# Patient Record
Sex: Female | Born: 1935 | Race: White | Hispanic: No | Smoking: Former smoker
Health system: Southern US, Community
[De-identification: ages and names within clinical notes are randomized; demographics above are authoritative.]

## PROBLEM LIST (undated history)

## (undated) DIAGNOSIS — I10 Essential (primary) hypertension: Secondary | ICD-10-CM

## (undated) DIAGNOSIS — I509 Heart failure, unspecified: Secondary | ICD-10-CM

## (undated) DIAGNOSIS — I639 Cerebral infarction, unspecified: Secondary | ICD-10-CM

## (undated) DIAGNOSIS — E119 Type 2 diabetes mellitus without complications: Secondary | ICD-10-CM

## (undated) HISTORY — DX: Heart failure, unspecified: I50.9

## (undated) HISTORY — PX: BACK SURGERY: SHX140

## (undated) HISTORY — DX: Cerebral infarction, unspecified: I63.9

## (undated) HISTORY — DX: Essential (primary) hypertension: I10

## (undated) HISTORY — PX: APPENDECTOMY: SHX54

## (undated) HISTORY — DX: Type 2 diabetes mellitus without complications: E11.9

## (undated) HISTORY — PX: WRIST FRACTURE SURGERY: SHX121

## (undated) HISTORY — PX: OTHER SURGICAL HISTORY: SHX169

---

## 2015-09-07 DIAGNOSIS — E782 Mixed hyperlipidemia: Secondary | ICD-10-CM | POA: Diagnosis not present

## 2015-09-07 DIAGNOSIS — E1122 Type 2 diabetes mellitus with diabetic chronic kidney disease: Secondary | ICD-10-CM | POA: Diagnosis not present

## 2015-09-07 DIAGNOSIS — E119 Type 2 diabetes mellitus without complications: Secondary | ICD-10-CM | POA: Diagnosis not present

## 2015-09-10 DIAGNOSIS — J449 Chronic obstructive pulmonary disease, unspecified: Secondary | ICD-10-CM | POA: Diagnosis not present

## 2015-09-13 DIAGNOSIS — Z6832 Body mass index (BMI) 32.0-32.9, adult: Secondary | ICD-10-CM | POA: Diagnosis not present

## 2015-09-13 DIAGNOSIS — E1142 Type 2 diabetes mellitus with diabetic polyneuropathy: Secondary | ICD-10-CM | POA: Diagnosis not present

## 2015-09-13 DIAGNOSIS — Z139 Encounter for screening, unspecified: Secondary | ICD-10-CM | POA: Diagnosis not present

## 2015-09-13 DIAGNOSIS — Z0000 Encounter for general adult medical examination without abnormal findings: Secondary | ICD-10-CM | POA: Diagnosis not present

## 2015-09-13 DIAGNOSIS — E782 Mixed hyperlipidemia: Secondary | ICD-10-CM | POA: Diagnosis not present

## 2015-09-13 DIAGNOSIS — Z1389 Encounter for screening for other disorder: Secondary | ICD-10-CM | POA: Diagnosis not present

## 2015-09-13 DIAGNOSIS — I129 Hypertensive chronic kidney disease with stage 1 through stage 4 chronic kidney disease, or unspecified chronic kidney disease: Secondary | ICD-10-CM | POA: Diagnosis not present

## 2015-09-13 DIAGNOSIS — E1169 Type 2 diabetes mellitus with other specified complication: Secondary | ICD-10-CM | POA: Diagnosis not present

## 2015-10-10 DIAGNOSIS — J449 Chronic obstructive pulmonary disease, unspecified: Secondary | ICD-10-CM | POA: Diagnosis not present

## 2015-10-12 DIAGNOSIS — J9611 Chronic respiratory failure with hypoxia: Secondary | ICD-10-CM | POA: Diagnosis not present

## 2015-10-12 DIAGNOSIS — J449 Chronic obstructive pulmonary disease, unspecified: Secondary | ICD-10-CM | POA: Diagnosis not present

## 2015-10-12 DIAGNOSIS — Z9981 Dependence on supplemental oxygen: Secondary | ICD-10-CM | POA: Diagnosis not present

## 2015-10-12 DIAGNOSIS — Z6832 Body mass index (BMI) 32.0-32.9, adult: Secondary | ICD-10-CM | POA: Diagnosis not present

## 2015-11-08 DIAGNOSIS — J449 Chronic obstructive pulmonary disease, unspecified: Secondary | ICD-10-CM | POA: Diagnosis not present

## 2015-12-09 DIAGNOSIS — J449 Chronic obstructive pulmonary disease, unspecified: Secondary | ICD-10-CM | POA: Diagnosis not present

## 2016-01-08 DIAGNOSIS — J449 Chronic obstructive pulmonary disease, unspecified: Secondary | ICD-10-CM | POA: Diagnosis not present

## 2016-01-16 DIAGNOSIS — E782 Mixed hyperlipidemia: Secondary | ICD-10-CM | POA: Diagnosis not present

## 2016-01-16 DIAGNOSIS — E1122 Type 2 diabetes mellitus with diabetic chronic kidney disease: Secondary | ICD-10-CM | POA: Diagnosis not present

## 2016-01-16 DIAGNOSIS — E1142 Type 2 diabetes mellitus with diabetic polyneuropathy: Secondary | ICD-10-CM | POA: Diagnosis not present

## 2016-01-23 DIAGNOSIS — E1142 Type 2 diabetes mellitus with diabetic polyneuropathy: Secondary | ICD-10-CM | POA: Diagnosis not present

## 2016-01-23 DIAGNOSIS — E1169 Type 2 diabetes mellitus with other specified complication: Secondary | ICD-10-CM | POA: Diagnosis not present

## 2016-01-23 DIAGNOSIS — Z9981 Dependence on supplemental oxygen: Secondary | ICD-10-CM | POA: Diagnosis not present

## 2016-01-23 DIAGNOSIS — Z7189 Other specified counseling: Secondary | ICD-10-CM | POA: Diagnosis not present

## 2016-01-23 DIAGNOSIS — J449 Chronic obstructive pulmonary disease, unspecified: Secondary | ICD-10-CM | POA: Diagnosis not present

## 2016-01-30 DIAGNOSIS — J449 Chronic obstructive pulmonary disease, unspecified: Secondary | ICD-10-CM | POA: Diagnosis not present

## 2016-02-08 DIAGNOSIS — J449 Chronic obstructive pulmonary disease, unspecified: Secondary | ICD-10-CM | POA: Diagnosis not present

## 2016-03-09 DIAGNOSIS — J449 Chronic obstructive pulmonary disease, unspecified: Secondary | ICD-10-CM | POA: Diagnosis not present

## 2016-04-09 DIAGNOSIS — J449 Chronic obstructive pulmonary disease, unspecified: Secondary | ICD-10-CM | POA: Diagnosis not present

## 2016-05-10 DIAGNOSIS — J449 Chronic obstructive pulmonary disease, unspecified: Secondary | ICD-10-CM | POA: Diagnosis not present

## 2016-05-30 DIAGNOSIS — H353232 Exudative age-related macular degeneration, bilateral, with inactive choroidal neovascularization: Secondary | ICD-10-CM | POA: Diagnosis not present

## 2016-06-09 DIAGNOSIS — J449 Chronic obstructive pulmonary disease, unspecified: Secondary | ICD-10-CM | POA: Diagnosis not present

## 2016-06-12 DIAGNOSIS — E1142 Type 2 diabetes mellitus with diabetic polyneuropathy: Secondary | ICD-10-CM | POA: Diagnosis not present

## 2016-06-12 DIAGNOSIS — E1122 Type 2 diabetes mellitus with diabetic chronic kidney disease: Secondary | ICD-10-CM | POA: Diagnosis not present

## 2016-06-12 DIAGNOSIS — E782 Mixed hyperlipidemia: Secondary | ICD-10-CM | POA: Diagnosis not present

## 2016-06-24 DIAGNOSIS — I129 Hypertensive chronic kidney disease with stage 1 through stage 4 chronic kidney disease, or unspecified chronic kidney disease: Secondary | ICD-10-CM | POA: Diagnosis not present

## 2016-06-24 DIAGNOSIS — E1142 Type 2 diabetes mellitus with diabetic polyneuropathy: Secondary | ICD-10-CM | POA: Diagnosis not present

## 2016-06-24 DIAGNOSIS — N183 Chronic kidney disease, stage 3 (moderate): Secondary | ICD-10-CM | POA: Diagnosis not present

## 2016-06-24 DIAGNOSIS — E782 Mixed hyperlipidemia: Secondary | ICD-10-CM | POA: Diagnosis not present

## 2016-07-10 DIAGNOSIS — J449 Chronic obstructive pulmonary disease, unspecified: Secondary | ICD-10-CM | POA: Diagnosis not present

## 2016-07-16 DIAGNOSIS — J449 Chronic obstructive pulmonary disease, unspecified: Secondary | ICD-10-CM | POA: Diagnosis not present

## 2016-07-16 DIAGNOSIS — Z6835 Body mass index (BMI) 35.0-35.9, adult: Secondary | ICD-10-CM | POA: Diagnosis not present

## 2016-08-09 DIAGNOSIS — J449 Chronic obstructive pulmonary disease, unspecified: Secondary | ICD-10-CM | POA: Diagnosis not present

## 2016-09-09 DIAGNOSIS — J449 Chronic obstructive pulmonary disease, unspecified: Secondary | ICD-10-CM | POA: Diagnosis not present

## 2016-09-11 DIAGNOSIS — B351 Tinea unguium: Secondary | ICD-10-CM | POA: Diagnosis not present

## 2016-09-17 DIAGNOSIS — B351 Tinea unguium: Secondary | ICD-10-CM | POA: Diagnosis not present

## 2016-09-25 DIAGNOSIS — B353 Tinea pedis: Secondary | ICD-10-CM | POA: Diagnosis not present

## 2016-10-09 DIAGNOSIS — J449 Chronic obstructive pulmonary disease, unspecified: Secondary | ICD-10-CM | POA: Diagnosis not present

## 2016-10-16 DIAGNOSIS — E782 Mixed hyperlipidemia: Secondary | ICD-10-CM | POA: Diagnosis not present

## 2016-10-16 DIAGNOSIS — E1142 Type 2 diabetes mellitus with diabetic polyneuropathy: Secondary | ICD-10-CM | POA: Diagnosis not present

## 2016-10-16 DIAGNOSIS — E1122 Type 2 diabetes mellitus with diabetic chronic kidney disease: Secondary | ICD-10-CM | POA: Diagnosis not present

## 2016-10-17 DIAGNOSIS — I739 Peripheral vascular disease, unspecified: Secondary | ICD-10-CM | POA: Diagnosis not present

## 2016-10-25 DIAGNOSIS — Z0000 Encounter for general adult medical examination without abnormal findings: Secondary | ICD-10-CM | POA: Diagnosis not present

## 2016-10-25 DIAGNOSIS — E1122 Type 2 diabetes mellitus with diabetic chronic kidney disease: Secondary | ICD-10-CM | POA: Diagnosis not present

## 2016-10-25 DIAGNOSIS — E1169 Type 2 diabetes mellitus with other specified complication: Secondary | ICD-10-CM | POA: Diagnosis not present

## 2016-10-25 DIAGNOSIS — E1142 Type 2 diabetes mellitus with diabetic polyneuropathy: Secondary | ICD-10-CM | POA: Diagnosis not present

## 2016-10-25 DIAGNOSIS — N183 Chronic kidney disease, stage 3 (moderate): Secondary | ICD-10-CM | POA: Diagnosis not present

## 2016-11-07 DIAGNOSIS — J449 Chronic obstructive pulmonary disease, unspecified: Secondary | ICD-10-CM | POA: Diagnosis not present

## 2016-11-15 DIAGNOSIS — E1122 Type 2 diabetes mellitus with diabetic chronic kidney disease: Secondary | ICD-10-CM | POA: Diagnosis not present

## 2016-11-15 DIAGNOSIS — E782 Mixed hyperlipidemia: Secondary | ICD-10-CM | POA: Diagnosis not present

## 2016-11-15 DIAGNOSIS — E1142 Type 2 diabetes mellitus with diabetic polyneuropathy: Secondary | ICD-10-CM | POA: Diagnosis not present

## 2016-11-27 DIAGNOSIS — I129 Hypertensive chronic kidney disease with stage 1 through stage 4 chronic kidney disease, or unspecified chronic kidney disease: Secondary | ICD-10-CM | POA: Diagnosis not present

## 2016-11-27 DIAGNOSIS — E782 Mixed hyperlipidemia: Secondary | ICD-10-CM | POA: Diagnosis not present

## 2016-11-27 DIAGNOSIS — N183 Chronic kidney disease, stage 3 (moderate): Secondary | ICD-10-CM | POA: Diagnosis not present

## 2016-11-27 DIAGNOSIS — E1169 Type 2 diabetes mellitus with other specified complication: Secondary | ICD-10-CM | POA: Diagnosis not present

## 2016-12-08 DIAGNOSIS — J449 Chronic obstructive pulmonary disease, unspecified: Secondary | ICD-10-CM | POA: Diagnosis not present

## 2016-12-24 DIAGNOSIS — J449 Chronic obstructive pulmonary disease, unspecified: Secondary | ICD-10-CM | POA: Diagnosis not present

## 2016-12-24 DIAGNOSIS — Z6836 Body mass index (BMI) 36.0-36.9, adult: Secondary | ICD-10-CM | POA: Diagnosis not present

## 2016-12-24 DIAGNOSIS — B029 Zoster without complications: Secondary | ICD-10-CM | POA: Diagnosis not present

## 2016-12-24 DIAGNOSIS — E1142 Type 2 diabetes mellitus with diabetic polyneuropathy: Secondary | ICD-10-CM | POA: Diagnosis not present

## 2016-12-25 DIAGNOSIS — Z6836 Body mass index (BMI) 36.0-36.9, adult: Secondary | ICD-10-CM | POA: Diagnosis not present

## 2016-12-25 DIAGNOSIS — N183 Chronic kidney disease, stage 3 (moderate): Secondary | ICD-10-CM | POA: Diagnosis not present

## 2016-12-25 DIAGNOSIS — R5383 Other fatigue: Secondary | ICD-10-CM | POA: Diagnosis not present

## 2016-12-25 DIAGNOSIS — I129 Hypertensive chronic kidney disease with stage 1 through stage 4 chronic kidney disease, or unspecified chronic kidney disease: Secondary | ICD-10-CM | POA: Diagnosis not present

## 2016-12-27 DIAGNOSIS — E1142 Type 2 diabetes mellitus with diabetic polyneuropathy: Secondary | ICD-10-CM | POA: Diagnosis not present

## 2016-12-31 DIAGNOSIS — I129 Hypertensive chronic kidney disease with stage 1 through stage 4 chronic kidney disease, or unspecified chronic kidney disease: Secondary | ICD-10-CM | POA: Diagnosis not present

## 2016-12-31 DIAGNOSIS — N183 Chronic kidney disease, stage 3 (moderate): Secondary | ICD-10-CM | POA: Diagnosis not present

## 2016-12-31 DIAGNOSIS — B372 Candidiasis of skin and nail: Secondary | ICD-10-CM | POA: Diagnosis not present

## 2017-01-07 DIAGNOSIS — J449 Chronic obstructive pulmonary disease, unspecified: Secondary | ICD-10-CM | POA: Diagnosis not present

## 2017-01-10 DIAGNOSIS — I129 Hypertensive chronic kidney disease with stage 1 through stage 4 chronic kidney disease, or unspecified chronic kidney disease: Secondary | ICD-10-CM | POA: Diagnosis not present

## 2017-01-10 DIAGNOSIS — Z6835 Body mass index (BMI) 35.0-35.9, adult: Secondary | ICD-10-CM | POA: Diagnosis not present

## 2017-01-10 DIAGNOSIS — N183 Chronic kidney disease, stage 3 (moderate): Secondary | ICD-10-CM | POA: Diagnosis not present

## 2017-01-28 DIAGNOSIS — B3781 Candidal esophagitis: Secondary | ICD-10-CM | POA: Diagnosis not present

## 2017-01-28 DIAGNOSIS — I10 Essential (primary) hypertension: Secondary | ICD-10-CM | POA: Diagnosis not present

## 2017-01-28 DIAGNOSIS — Z1389 Encounter for screening for other disorder: Secondary | ICD-10-CM | POA: Diagnosis not present

## 2017-01-28 DIAGNOSIS — M79609 Pain in unspecified limb: Secondary | ICD-10-CM | POA: Diagnosis not present

## 2017-01-28 DIAGNOSIS — N390 Urinary tract infection, site not specified: Secondary | ICD-10-CM | POA: Diagnosis not present

## 2017-01-28 DIAGNOSIS — Z139 Encounter for screening, unspecified: Secondary | ICD-10-CM | POA: Diagnosis not present

## 2017-01-28 DIAGNOSIS — F3160 Bipolar disorder, current episode mixed, unspecified: Secondary | ICD-10-CM | POA: Diagnosis not present

## 2017-01-28 DIAGNOSIS — B370 Candidal stomatitis: Secondary | ICD-10-CM | POA: Diagnosis not present

## 2017-02-05 DIAGNOSIS — Z6834 Body mass index (BMI) 34.0-34.9, adult: Secondary | ICD-10-CM | POA: Diagnosis not present

## 2017-02-05 DIAGNOSIS — B3781 Candidal esophagitis: Secondary | ICD-10-CM | POA: Diagnosis not present

## 2017-02-05 DIAGNOSIS — B370 Candidal stomatitis: Secondary | ICD-10-CM | POA: Diagnosis not present

## 2017-02-07 DIAGNOSIS — J449 Chronic obstructive pulmonary disease, unspecified: Secondary | ICD-10-CM | POA: Diagnosis not present

## 2017-02-27 DIAGNOSIS — L820 Inflamed seborrheic keratosis: Secondary | ICD-10-CM | POA: Diagnosis not present

## 2017-02-27 DIAGNOSIS — D2239 Melanocytic nevi of other parts of face: Secondary | ICD-10-CM | POA: Diagnosis not present

## 2017-03-09 DIAGNOSIS — J449 Chronic obstructive pulmonary disease, unspecified: Secondary | ICD-10-CM | POA: Diagnosis not present

## 2017-04-09 DIAGNOSIS — J449 Chronic obstructive pulmonary disease, unspecified: Secondary | ICD-10-CM | POA: Diagnosis not present

## 2017-04-16 DIAGNOSIS — E1142 Type 2 diabetes mellitus with diabetic polyneuropathy: Secondary | ICD-10-CM | POA: Diagnosis not present

## 2017-04-16 DIAGNOSIS — E782 Mixed hyperlipidemia: Secondary | ICD-10-CM | POA: Diagnosis not present

## 2017-04-16 DIAGNOSIS — E1122 Type 2 diabetes mellitus with diabetic chronic kidney disease: Secondary | ICD-10-CM | POA: Diagnosis not present

## 2017-04-21 DIAGNOSIS — E1122 Type 2 diabetes mellitus with diabetic chronic kidney disease: Secondary | ICD-10-CM | POA: Diagnosis not present

## 2017-04-21 DIAGNOSIS — E1142 Type 2 diabetes mellitus with diabetic polyneuropathy: Secondary | ICD-10-CM | POA: Diagnosis not present

## 2017-04-25 DIAGNOSIS — Z23 Encounter for immunization: Secondary | ICD-10-CM | POA: Diagnosis not present

## 2017-04-25 DIAGNOSIS — E1142 Type 2 diabetes mellitus with diabetic polyneuropathy: Secondary | ICD-10-CM | POA: Diagnosis not present

## 2017-04-25 DIAGNOSIS — I129 Hypertensive chronic kidney disease with stage 1 through stage 4 chronic kidney disease, or unspecified chronic kidney disease: Secondary | ICD-10-CM | POA: Diagnosis not present

## 2017-04-25 DIAGNOSIS — E782 Mixed hyperlipidemia: Secondary | ICD-10-CM | POA: Diagnosis not present

## 2017-04-25 DIAGNOSIS — N183 Chronic kidney disease, stage 3 (moderate): Secondary | ICD-10-CM | POA: Diagnosis not present

## 2017-05-10 DIAGNOSIS — J449 Chronic obstructive pulmonary disease, unspecified: Secondary | ICD-10-CM | POA: Diagnosis not present

## 2017-05-13 DIAGNOSIS — L304 Erythema intertrigo: Secondary | ICD-10-CM | POA: Diagnosis not present

## 2017-06-05 DIAGNOSIS — H353232 Exudative age-related macular degeneration, bilateral, with inactive choroidal neovascularization: Secondary | ICD-10-CM | POA: Diagnosis not present

## 2017-06-09 DIAGNOSIS — J449 Chronic obstructive pulmonary disease, unspecified: Secondary | ICD-10-CM | POA: Diagnosis not present

## 2017-06-24 DIAGNOSIS — E860 Dehydration: Secondary | ICD-10-CM | POA: Diagnosis not present

## 2017-06-24 DIAGNOSIS — J441 Chronic obstructive pulmonary disease with (acute) exacerbation: Secondary | ICD-10-CM | POA: Diagnosis not present

## 2017-06-24 DIAGNOSIS — J9611 Chronic respiratory failure with hypoxia: Secondary | ICD-10-CM | POA: Diagnosis not present

## 2017-06-24 DIAGNOSIS — R8290 Unspecified abnormal findings in urine: Secondary | ICD-10-CM | POA: Diagnosis not present

## 2017-07-02 DIAGNOSIS — N183 Chronic kidney disease, stage 3 (moderate): Secondary | ICD-10-CM | POA: Diagnosis not present

## 2017-07-02 DIAGNOSIS — I129 Hypertensive chronic kidney disease with stage 1 through stage 4 chronic kidney disease, or unspecified chronic kidney disease: Secondary | ICD-10-CM | POA: Diagnosis not present

## 2017-07-02 DIAGNOSIS — E860 Dehydration: Secondary | ICD-10-CM | POA: Diagnosis not present

## 2017-07-02 DIAGNOSIS — E1122 Type 2 diabetes mellitus with diabetic chronic kidney disease: Secondary | ICD-10-CM | POA: Diagnosis not present

## 2017-07-10 DIAGNOSIS — J449 Chronic obstructive pulmonary disease, unspecified: Secondary | ICD-10-CM | POA: Diagnosis not present

## 2017-08-09 DIAGNOSIS — J449 Chronic obstructive pulmonary disease, unspecified: Secondary | ICD-10-CM | POA: Diagnosis not present

## 2017-08-14 DIAGNOSIS — T1490XA Injury, unspecified, initial encounter: Secondary | ICD-10-CM | POA: Diagnosis not present

## 2017-08-25 DIAGNOSIS — E782 Mixed hyperlipidemia: Secondary | ICD-10-CM | POA: Diagnosis not present

## 2017-08-25 DIAGNOSIS — E1122 Type 2 diabetes mellitus with diabetic chronic kidney disease: Secondary | ICD-10-CM | POA: Diagnosis not present

## 2017-08-25 DIAGNOSIS — E1142 Type 2 diabetes mellitus with diabetic polyneuropathy: Secondary | ICD-10-CM | POA: Diagnosis not present

## 2017-09-05 DIAGNOSIS — N183 Chronic kidney disease, stage 3 (moderate): Secondary | ICD-10-CM | POA: Diagnosis not present

## 2017-09-05 DIAGNOSIS — E782 Mixed hyperlipidemia: Secondary | ICD-10-CM | POA: Diagnosis not present

## 2017-09-05 DIAGNOSIS — E1122 Type 2 diabetes mellitus with diabetic chronic kidney disease: Secondary | ICD-10-CM | POA: Diagnosis not present

## 2017-09-05 DIAGNOSIS — R202 Paresthesia of skin: Secondary | ICD-10-CM | POA: Diagnosis not present

## 2017-09-05 DIAGNOSIS — Z1321 Encounter for screening for nutritional disorder: Secondary | ICD-10-CM | POA: Diagnosis not present

## 2017-09-05 DIAGNOSIS — R5383 Other fatigue: Secondary | ICD-10-CM | POA: Diagnosis not present

## 2017-09-05 DIAGNOSIS — R32 Unspecified urinary incontinence: Secondary | ICD-10-CM | POA: Diagnosis not present

## 2017-09-05 DIAGNOSIS — E1142 Type 2 diabetes mellitus with diabetic polyneuropathy: Secondary | ICD-10-CM | POA: Diagnosis not present

## 2017-09-09 DIAGNOSIS — J449 Chronic obstructive pulmonary disease, unspecified: Secondary | ICD-10-CM | POA: Diagnosis not present

## 2017-09-11 DIAGNOSIS — E538 Deficiency of other specified B group vitamins: Secondary | ICD-10-CM | POA: Diagnosis not present

## 2017-09-25 DIAGNOSIS — E538 Deficiency of other specified B group vitamins: Secondary | ICD-10-CM | POA: Diagnosis not present

## 2017-10-02 DIAGNOSIS — E538 Deficiency of other specified B group vitamins: Secondary | ICD-10-CM | POA: Diagnosis not present

## 2017-10-09 DIAGNOSIS — J449 Chronic obstructive pulmonary disease, unspecified: Secondary | ICD-10-CM | POA: Diagnosis not present

## 2017-10-30 DIAGNOSIS — B002 Herpesviral gingivostomatitis and pharyngotonsillitis: Secondary | ICD-10-CM | POA: Diagnosis not present

## 2017-10-30 DIAGNOSIS — Z1331 Encounter for screening for depression: Secondary | ICD-10-CM | POA: Diagnosis not present

## 2017-10-30 DIAGNOSIS — Z0000 Encounter for general adult medical examination without abnormal findings: Secondary | ICD-10-CM | POA: Diagnosis not present

## 2017-10-30 DIAGNOSIS — Z139 Encounter for screening, unspecified: Secondary | ICD-10-CM | POA: Diagnosis not present

## 2017-10-30 DIAGNOSIS — Z9181 History of falling: Secondary | ICD-10-CM | POA: Diagnosis not present

## 2017-11-07 DIAGNOSIS — J449 Chronic obstructive pulmonary disease, unspecified: Secondary | ICD-10-CM | POA: Diagnosis not present

## 2017-12-04 DIAGNOSIS — E1122 Type 2 diabetes mellitus with diabetic chronic kidney disease: Secondary | ICD-10-CM | POA: Diagnosis not present

## 2017-12-04 DIAGNOSIS — I129 Hypertensive chronic kidney disease with stage 1 through stage 4 chronic kidney disease, or unspecified chronic kidney disease: Secondary | ICD-10-CM | POA: Diagnosis not present

## 2017-12-04 DIAGNOSIS — H26493 Other secondary cataract, bilateral: Secondary | ICD-10-CM | POA: Diagnosis not present

## 2017-12-04 DIAGNOSIS — E1169 Type 2 diabetes mellitus with other specified complication: Secondary | ICD-10-CM | POA: Diagnosis not present

## 2017-12-04 DIAGNOSIS — H353232 Exudative age-related macular degeneration, bilateral, with inactive choroidal neovascularization: Secondary | ICD-10-CM | POA: Diagnosis not present

## 2017-12-08 DIAGNOSIS — J449 Chronic obstructive pulmonary disease, unspecified: Secondary | ICD-10-CM | POA: Diagnosis not present

## 2018-01-07 DIAGNOSIS — E1169 Type 2 diabetes mellitus with other specified complication: Secondary | ICD-10-CM | POA: Diagnosis not present

## 2018-01-07 DIAGNOSIS — E538 Deficiency of other specified B group vitamins: Secondary | ICD-10-CM | POA: Diagnosis not present

## 2018-01-07 DIAGNOSIS — E782 Mixed hyperlipidemia: Secondary | ICD-10-CM | POA: Diagnosis not present

## 2018-01-07 DIAGNOSIS — J449 Chronic obstructive pulmonary disease, unspecified: Secondary | ICD-10-CM | POA: Diagnosis not present

## 2018-02-06 DIAGNOSIS — M810 Age-related osteoporosis without current pathological fracture: Secondary | ICD-10-CM | POA: Diagnosis not present

## 2018-02-06 DIAGNOSIS — E538 Deficiency of other specified B group vitamins: Secondary | ICD-10-CM | POA: Diagnosis not present

## 2018-02-06 DIAGNOSIS — E2839 Other primary ovarian failure: Secondary | ICD-10-CM | POA: Diagnosis not present

## 2018-02-06 DIAGNOSIS — Z1231 Encounter for screening mammogram for malignant neoplasm of breast: Secondary | ICD-10-CM | POA: Diagnosis not present

## 2018-02-07 DIAGNOSIS — J449 Chronic obstructive pulmonary disease, unspecified: Secondary | ICD-10-CM | POA: Diagnosis not present

## 2018-03-02 DIAGNOSIS — J449 Chronic obstructive pulmonary disease, unspecified: Secondary | ICD-10-CM | POA: Diagnosis not present

## 2018-03-02 DIAGNOSIS — H353230 Exudative age-related macular degeneration, bilateral, stage unspecified: Secondary | ICD-10-CM | POA: Diagnosis not present

## 2018-03-02 DIAGNOSIS — J9611 Chronic respiratory failure with hypoxia: Secondary | ICD-10-CM | POA: Diagnosis not present

## 2018-03-02 DIAGNOSIS — R1314 Dysphagia, pharyngoesophageal phase: Secondary | ICD-10-CM | POA: Diagnosis not present

## 2018-03-06 DIAGNOSIS — E538 Deficiency of other specified B group vitamins: Secondary | ICD-10-CM | POA: Diagnosis not present

## 2018-03-09 DIAGNOSIS — J449 Chronic obstructive pulmonary disease, unspecified: Secondary | ICD-10-CM | POA: Diagnosis not present

## 2018-03-19 DIAGNOSIS — R1310 Dysphagia, unspecified: Secondary | ICD-10-CM | POA: Diagnosis not present

## 2018-03-23 DIAGNOSIS — K449 Diaphragmatic hernia without obstruction or gangrene: Secondary | ICD-10-CM | POA: Diagnosis not present

## 2018-03-23 DIAGNOSIS — R1310 Dysphagia, unspecified: Secondary | ICD-10-CM | POA: Diagnosis not present

## 2018-04-07 DIAGNOSIS — E538 Deficiency of other specified B group vitamins: Secondary | ICD-10-CM | POA: Diagnosis not present

## 2018-04-09 DIAGNOSIS — J449 Chronic obstructive pulmonary disease, unspecified: Secondary | ICD-10-CM | POA: Diagnosis not present

## 2018-05-10 DIAGNOSIS — J449 Chronic obstructive pulmonary disease, unspecified: Secondary | ICD-10-CM | POA: Diagnosis not present

## 2018-05-11 DIAGNOSIS — E1169 Type 2 diabetes mellitus with other specified complication: Secondary | ICD-10-CM | POA: Diagnosis not present

## 2018-05-11 DIAGNOSIS — E1142 Type 2 diabetes mellitus with diabetic polyneuropathy: Secondary | ICD-10-CM | POA: Diagnosis not present

## 2018-05-11 DIAGNOSIS — I129 Hypertensive chronic kidney disease with stage 1 through stage 4 chronic kidney disease, or unspecified chronic kidney disease: Secondary | ICD-10-CM | POA: Diagnosis not present

## 2018-06-08 DIAGNOSIS — E1142 Type 2 diabetes mellitus with diabetic polyneuropathy: Secondary | ICD-10-CM | POA: Diagnosis not present

## 2018-06-08 DIAGNOSIS — E1169 Type 2 diabetes mellitus with other specified complication: Secondary | ICD-10-CM | POA: Diagnosis not present

## 2018-06-08 DIAGNOSIS — Z23 Encounter for immunization: Secondary | ICD-10-CM | POA: Diagnosis not present

## 2018-06-08 DIAGNOSIS — N183 Chronic kidney disease, stage 3 (moderate): Secondary | ICD-10-CM | POA: Diagnosis not present

## 2018-06-08 DIAGNOSIS — I129 Hypertensive chronic kidney disease with stage 1 through stage 4 chronic kidney disease, or unspecified chronic kidney disease: Secondary | ICD-10-CM | POA: Diagnosis not present

## 2018-06-09 DIAGNOSIS — J449 Chronic obstructive pulmonary disease, unspecified: Secondary | ICD-10-CM | POA: Diagnosis not present

## 2018-06-18 DIAGNOSIS — M6281 Muscle weakness (generalized): Secondary | ICD-10-CM | POA: Diagnosis not present

## 2018-06-18 DIAGNOSIS — Z6836 Body mass index (BMI) 36.0-36.9, adult: Secondary | ICD-10-CM | POA: Diagnosis not present

## 2018-06-22 DIAGNOSIS — R509 Fever, unspecified: Secondary | ICD-10-CM | POA: Diagnosis not present

## 2018-06-22 DIAGNOSIS — Z6836 Body mass index (BMI) 36.0-36.9, adult: Secondary | ICD-10-CM | POA: Diagnosis not present

## 2018-06-22 DIAGNOSIS — E538 Deficiency of other specified B group vitamins: Secondary | ICD-10-CM | POA: Diagnosis not present

## 2018-06-22 DIAGNOSIS — R5383 Other fatigue: Secondary | ICD-10-CM | POA: Diagnosis not present

## 2018-06-25 DIAGNOSIS — M5442 Lumbago with sciatica, left side: Secondary | ICD-10-CM | POA: Diagnosis not present

## 2018-06-25 DIAGNOSIS — M545 Low back pain: Secondary | ICD-10-CM | POA: Diagnosis not present

## 2018-06-25 DIAGNOSIS — M2560 Stiffness of unspecified joint, not elsewhere classified: Secondary | ICD-10-CM | POA: Diagnosis not present

## 2018-06-25 DIAGNOSIS — M25552 Pain in left hip: Secondary | ICD-10-CM | POA: Diagnosis not present

## 2018-07-10 DIAGNOSIS — J449 Chronic obstructive pulmonary disease, unspecified: Secondary | ICD-10-CM | POA: Diagnosis not present

## 2018-07-14 DIAGNOSIS — M25552 Pain in left hip: Secondary | ICD-10-CM | POA: Diagnosis not present

## 2018-07-14 DIAGNOSIS — M545 Low back pain: Secondary | ICD-10-CM | POA: Diagnosis not present

## 2018-07-14 DIAGNOSIS — M2560 Stiffness of unspecified joint, not elsewhere classified: Secondary | ICD-10-CM | POA: Diagnosis not present

## 2018-07-14 DIAGNOSIS — M5442 Lumbago with sciatica, left side: Secondary | ICD-10-CM | POA: Diagnosis not present

## 2018-07-16 DIAGNOSIS — M5442 Lumbago with sciatica, left side: Secondary | ICD-10-CM | POA: Diagnosis not present

## 2018-07-16 DIAGNOSIS — M2560 Stiffness of unspecified joint, not elsewhere classified: Secondary | ICD-10-CM | POA: Diagnosis not present

## 2018-07-16 DIAGNOSIS — M25552 Pain in left hip: Secondary | ICD-10-CM | POA: Diagnosis not present

## 2018-07-16 DIAGNOSIS — M545 Low back pain: Secondary | ICD-10-CM | POA: Diagnosis not present

## 2018-07-21 DIAGNOSIS — M5442 Lumbago with sciatica, left side: Secondary | ICD-10-CM | POA: Diagnosis not present

## 2018-07-21 DIAGNOSIS — M2560 Stiffness of unspecified joint, not elsewhere classified: Secondary | ICD-10-CM | POA: Diagnosis not present

## 2018-07-21 DIAGNOSIS — M545 Low back pain: Secondary | ICD-10-CM | POA: Diagnosis not present

## 2018-07-21 DIAGNOSIS — M25552 Pain in left hip: Secondary | ICD-10-CM | POA: Diagnosis not present

## 2018-07-23 DIAGNOSIS — E1122 Type 2 diabetes mellitus with diabetic chronic kidney disease: Secondary | ICD-10-CM | POA: Diagnosis not present

## 2018-07-23 DIAGNOSIS — N183 Chronic kidney disease, stage 3 (moderate): Secondary | ICD-10-CM | POA: Diagnosis not present

## 2018-07-23 DIAGNOSIS — E538 Deficiency of other specified B group vitamins: Secondary | ICD-10-CM | POA: Diagnosis not present

## 2018-07-23 DIAGNOSIS — M545 Low back pain: Secondary | ICD-10-CM | POA: Diagnosis not present

## 2018-07-23 DIAGNOSIS — I129 Hypertensive chronic kidney disease with stage 1 through stage 4 chronic kidney disease, or unspecified chronic kidney disease: Secondary | ICD-10-CM | POA: Diagnosis not present

## 2018-07-28 DIAGNOSIS — M5442 Lumbago with sciatica, left side: Secondary | ICD-10-CM | POA: Diagnosis not present

## 2018-07-28 DIAGNOSIS — M2560 Stiffness of unspecified joint, not elsewhere classified: Secondary | ICD-10-CM | POA: Diagnosis not present

## 2018-07-28 DIAGNOSIS — M545 Low back pain: Secondary | ICD-10-CM | POA: Diagnosis not present

## 2018-07-28 DIAGNOSIS — M25552 Pain in left hip: Secondary | ICD-10-CM | POA: Diagnosis not present

## 2018-07-31 DIAGNOSIS — M25552 Pain in left hip: Secondary | ICD-10-CM | POA: Diagnosis not present

## 2018-07-31 DIAGNOSIS — M2560 Stiffness of unspecified joint, not elsewhere classified: Secondary | ICD-10-CM | POA: Diagnosis not present

## 2018-07-31 DIAGNOSIS — M545 Low back pain: Secondary | ICD-10-CM | POA: Diagnosis not present

## 2018-07-31 DIAGNOSIS — M5442 Lumbago with sciatica, left side: Secondary | ICD-10-CM | POA: Diagnosis not present

## 2018-08-07 DIAGNOSIS — M545 Low back pain: Secondary | ICD-10-CM | POA: Diagnosis not present

## 2018-08-07 DIAGNOSIS — M5442 Lumbago with sciatica, left side: Secondary | ICD-10-CM | POA: Diagnosis not present

## 2018-08-07 DIAGNOSIS — M25552 Pain in left hip: Secondary | ICD-10-CM | POA: Diagnosis not present

## 2018-08-07 DIAGNOSIS — M2560 Stiffness of unspecified joint, not elsewhere classified: Secondary | ICD-10-CM | POA: Diagnosis not present

## 2018-08-09 DIAGNOSIS — J449 Chronic obstructive pulmonary disease, unspecified: Secondary | ICD-10-CM | POA: Diagnosis not present

## 2018-08-26 DIAGNOSIS — H353211 Exudative age-related macular degeneration, right eye, with active choroidal neovascularization: Secondary | ICD-10-CM | POA: Diagnosis not present

## 2018-09-08 DIAGNOSIS — Z79899 Other long term (current) drug therapy: Secondary | ICD-10-CM | POA: Diagnosis not present

## 2018-09-08 DIAGNOSIS — K5900 Constipation, unspecified: Secondary | ICD-10-CM | POA: Diagnosis not present

## 2018-09-08 DIAGNOSIS — I10 Essential (primary) hypertension: Secondary | ICD-10-CM | POA: Diagnosis not present

## 2018-09-08 DIAGNOSIS — Z8673 Personal history of transient ischemic attack (TIA), and cerebral infarction without residual deficits: Secondary | ICD-10-CM | POA: Diagnosis not present

## 2018-09-08 DIAGNOSIS — E119 Type 2 diabetes mellitus without complications: Secondary | ICD-10-CM | POA: Diagnosis not present

## 2018-09-24 DIAGNOSIS — H353211 Exudative age-related macular degeneration, right eye, with active choroidal neovascularization: Secondary | ICD-10-CM | POA: Diagnosis not present

## 2018-09-24 DIAGNOSIS — D3131 Benign neoplasm of right choroid: Secondary | ICD-10-CM | POA: Diagnosis not present

## 2018-09-24 DIAGNOSIS — H353122 Nonexudative age-related macular degeneration, left eye, intermediate dry stage: Secondary | ICD-10-CM | POA: Diagnosis not present

## 2018-10-06 DIAGNOSIS — D487 Neoplasm of uncertain behavior of other specified sites: Secondary | ICD-10-CM | POA: Diagnosis not present

## 2018-10-06 DIAGNOSIS — H35363 Drusen (degenerative) of macula, bilateral: Secondary | ICD-10-CM | POA: Diagnosis not present

## 2018-10-08 DIAGNOSIS — E1169 Type 2 diabetes mellitus with other specified complication: Secondary | ICD-10-CM | POA: Diagnosis not present

## 2018-10-08 DIAGNOSIS — I129 Hypertensive chronic kidney disease with stage 1 through stage 4 chronic kidney disease, or unspecified chronic kidney disease: Secondary | ICD-10-CM | POA: Diagnosis not present

## 2018-10-16 DIAGNOSIS — E1169 Type 2 diabetes mellitus with other specified complication: Secondary | ICD-10-CM | POA: Diagnosis not present

## 2018-10-16 DIAGNOSIS — N183 Chronic kidney disease, stage 3 (moderate): Secondary | ICD-10-CM | POA: Diagnosis not present

## 2018-10-16 DIAGNOSIS — I129 Hypertensive chronic kidney disease with stage 1 through stage 4 chronic kidney disease, or unspecified chronic kidney disease: Secondary | ICD-10-CM | POA: Diagnosis not present

## 2018-10-16 DIAGNOSIS — E782 Mixed hyperlipidemia: Secondary | ICD-10-CM | POA: Diagnosis not present

## 2019-01-12 DIAGNOSIS — H35363 Drusen (degenerative) of macula, bilateral: Secondary | ICD-10-CM | POA: Diagnosis not present

## 2019-01-12 DIAGNOSIS — D487 Neoplasm of uncertain behavior of other specified sites: Secondary | ICD-10-CM | POA: Diagnosis not present

## 2019-01-12 DIAGNOSIS — H35351 Cystoid macular degeneration, right eye: Secondary | ICD-10-CM | POA: Diagnosis not present

## 2019-02-08 DIAGNOSIS — E1142 Type 2 diabetes mellitus with diabetic polyneuropathy: Secondary | ICD-10-CM | POA: Diagnosis not present

## 2019-02-08 DIAGNOSIS — E1169 Type 2 diabetes mellitus with other specified complication: Secondary | ICD-10-CM | POA: Diagnosis not present

## 2019-02-08 DIAGNOSIS — I129 Hypertensive chronic kidney disease with stage 1 through stage 4 chronic kidney disease, or unspecified chronic kidney disease: Secondary | ICD-10-CM | POA: Diagnosis not present

## 2019-02-15 DIAGNOSIS — I129 Hypertensive chronic kidney disease with stage 1 through stage 4 chronic kidney disease, or unspecified chronic kidney disease: Secondary | ICD-10-CM | POA: Diagnosis not present

## 2019-02-15 DIAGNOSIS — Z139 Encounter for screening, unspecified: Secondary | ICD-10-CM | POA: Diagnosis not present

## 2019-02-15 DIAGNOSIS — E1169 Type 2 diabetes mellitus with other specified complication: Secondary | ICD-10-CM | POA: Diagnosis not present

## 2019-02-15 DIAGNOSIS — E538 Deficiency of other specified B group vitamins: Secondary | ICD-10-CM | POA: Diagnosis not present

## 2019-02-15 DIAGNOSIS — E1142 Type 2 diabetes mellitus with diabetic polyneuropathy: Secondary | ICD-10-CM | POA: Diagnosis not present

## 2019-02-15 DIAGNOSIS — Z0000 Encounter for general adult medical examination without abnormal findings: Secondary | ICD-10-CM | POA: Diagnosis not present

## 2019-02-15 DIAGNOSIS — N183 Chronic kidney disease, stage 3 (moderate): Secondary | ICD-10-CM | POA: Diagnosis not present

## 2019-03-18 DIAGNOSIS — E538 Deficiency of other specified B group vitamins: Secondary | ICD-10-CM | POA: Diagnosis not present

## 2019-04-20 DIAGNOSIS — E538 Deficiency of other specified B group vitamins: Secondary | ICD-10-CM | POA: Diagnosis not present

## 2019-05-04 DIAGNOSIS — I129 Hypertensive chronic kidney disease with stage 1 through stage 4 chronic kidney disease, or unspecified chronic kidney disease: Secondary | ICD-10-CM | POA: Diagnosis not present

## 2019-05-04 DIAGNOSIS — N183 Chronic kidney disease, stage 3 (moderate): Secondary | ICD-10-CM | POA: Diagnosis not present

## 2019-05-04 DIAGNOSIS — E1142 Type 2 diabetes mellitus with diabetic polyneuropathy: Secondary | ICD-10-CM | POA: Diagnosis not present

## 2019-05-04 DIAGNOSIS — E782 Mixed hyperlipidemia: Secondary | ICD-10-CM | POA: Diagnosis not present

## 2019-05-12 DIAGNOSIS — E1142 Type 2 diabetes mellitus with diabetic polyneuropathy: Secondary | ICD-10-CM | POA: Diagnosis not present

## 2019-05-12 DIAGNOSIS — I129 Hypertensive chronic kidney disease with stage 1 through stage 4 chronic kidney disease, or unspecified chronic kidney disease: Secondary | ICD-10-CM | POA: Diagnosis not present

## 2019-05-12 DIAGNOSIS — E782 Mixed hyperlipidemia: Secondary | ICD-10-CM | POA: Diagnosis not present

## 2019-05-12 DIAGNOSIS — N183 Chronic kidney disease, stage 3 (moderate): Secondary | ICD-10-CM | POA: Diagnosis not present

## 2019-05-20 DIAGNOSIS — E538 Deficiency of other specified B group vitamins: Secondary | ICD-10-CM | POA: Diagnosis not present

## 2019-06-08 DIAGNOSIS — Z23 Encounter for immunization: Secondary | ICD-10-CM | POA: Diagnosis not present

## 2019-06-11 DIAGNOSIS — J449 Chronic obstructive pulmonary disease, unspecified: Secondary | ICD-10-CM | POA: Diagnosis not present

## 2019-06-11 DIAGNOSIS — I129 Hypertensive chronic kidney disease with stage 1 through stage 4 chronic kidney disease, or unspecified chronic kidney disease: Secondary | ICD-10-CM | POA: Diagnosis not present

## 2019-06-11 DIAGNOSIS — E1142 Type 2 diabetes mellitus with diabetic polyneuropathy: Secondary | ICD-10-CM | POA: Diagnosis not present

## 2019-06-11 DIAGNOSIS — E782 Mixed hyperlipidemia: Secondary | ICD-10-CM | POA: Diagnosis not present

## 2019-06-21 DIAGNOSIS — E1169 Type 2 diabetes mellitus with other specified complication: Secondary | ICD-10-CM | POA: Diagnosis not present

## 2019-06-21 DIAGNOSIS — I129 Hypertensive chronic kidney disease with stage 1 through stage 4 chronic kidney disease, or unspecified chronic kidney disease: Secondary | ICD-10-CM | POA: Diagnosis not present

## 2019-06-21 DIAGNOSIS — E1142 Type 2 diabetes mellitus with diabetic polyneuropathy: Secondary | ICD-10-CM | POA: Diagnosis not present

## 2019-06-28 DIAGNOSIS — N183 Chronic kidney disease, stage 3 unspecified: Secondary | ICD-10-CM | POA: Diagnosis not present

## 2019-06-28 DIAGNOSIS — E1169 Type 2 diabetes mellitus with other specified complication: Secondary | ICD-10-CM | POA: Diagnosis not present

## 2019-06-28 DIAGNOSIS — I129 Hypertensive chronic kidney disease with stage 1 through stage 4 chronic kidney disease, or unspecified chronic kidney disease: Secondary | ICD-10-CM | POA: Diagnosis not present

## 2019-06-28 DIAGNOSIS — E782 Mixed hyperlipidemia: Secondary | ICD-10-CM | POA: Diagnosis not present

## 2019-07-12 DIAGNOSIS — N183 Chronic kidney disease, stage 3 unspecified: Secondary | ICD-10-CM | POA: Diagnosis not present

## 2019-07-12 DIAGNOSIS — I129 Hypertensive chronic kidney disease with stage 1 through stage 4 chronic kidney disease, or unspecified chronic kidney disease: Secondary | ICD-10-CM | POA: Diagnosis not present

## 2019-07-12 DIAGNOSIS — E1169 Type 2 diabetes mellitus with other specified complication: Secondary | ICD-10-CM | POA: Diagnosis not present

## 2019-07-12 DIAGNOSIS — E782 Mixed hyperlipidemia: Secondary | ICD-10-CM | POA: Diagnosis not present

## 2019-09-02 DIAGNOSIS — H353211 Exudative age-related macular degeneration, right eye, with active choroidal neovascularization: Secondary | ICD-10-CM | POA: Diagnosis not present

## 2019-09-02 DIAGNOSIS — H353121 Nonexudative age-related macular degeneration, left eye, early dry stage: Secondary | ICD-10-CM | POA: Diagnosis not present

## 2019-09-02 DIAGNOSIS — E119 Type 2 diabetes mellitus without complications: Secondary | ICD-10-CM | POA: Diagnosis not present

## 2019-09-02 DIAGNOSIS — D3131 Benign neoplasm of right choroid: Secondary | ICD-10-CM | POA: Diagnosis not present

## 2019-09-10 DIAGNOSIS — N183 Chronic kidney disease, stage 3 unspecified: Secondary | ICD-10-CM | POA: Diagnosis not present

## 2019-09-10 DIAGNOSIS — I129 Hypertensive chronic kidney disease with stage 1 through stage 4 chronic kidney disease, or unspecified chronic kidney disease: Secondary | ICD-10-CM | POA: Diagnosis not present

## 2019-09-10 DIAGNOSIS — E782 Mixed hyperlipidemia: Secondary | ICD-10-CM | POA: Diagnosis not present

## 2019-09-10 DIAGNOSIS — E1169 Type 2 diabetes mellitus with other specified complication: Secondary | ICD-10-CM | POA: Diagnosis not present

## 2019-09-20 DIAGNOSIS — E1169 Type 2 diabetes mellitus with other specified complication: Secondary | ICD-10-CM | POA: Diagnosis not present

## 2019-09-29 DIAGNOSIS — H353211 Exudative age-related macular degeneration, right eye, with active choroidal neovascularization: Secondary | ICD-10-CM | POA: Diagnosis not present

## 2019-10-06 DIAGNOSIS — L570 Actinic keratosis: Secondary | ICD-10-CM | POA: Diagnosis not present

## 2019-10-06 DIAGNOSIS — L219 Seborrheic dermatitis, unspecified: Secondary | ICD-10-CM | POA: Diagnosis not present

## 2019-10-07 DIAGNOSIS — E782 Mixed hyperlipidemia: Secondary | ICD-10-CM | POA: Diagnosis not present

## 2019-10-07 DIAGNOSIS — N183 Chronic kidney disease, stage 3 unspecified: Secondary | ICD-10-CM | POA: Diagnosis not present

## 2019-10-07 DIAGNOSIS — E1169 Type 2 diabetes mellitus with other specified complication: Secondary | ICD-10-CM | POA: Diagnosis not present

## 2019-10-07 DIAGNOSIS — E1142 Type 2 diabetes mellitus with diabetic polyneuropathy: Secondary | ICD-10-CM | POA: Diagnosis not present

## 2019-10-10 DIAGNOSIS — E1169 Type 2 diabetes mellitus with other specified complication: Secondary | ICD-10-CM | POA: Diagnosis not present

## 2019-10-10 DIAGNOSIS — E1142 Type 2 diabetes mellitus with diabetic polyneuropathy: Secondary | ICD-10-CM | POA: Diagnosis not present

## 2019-10-10 DIAGNOSIS — E782 Mixed hyperlipidemia: Secondary | ICD-10-CM | POA: Diagnosis not present

## 2019-10-10 DIAGNOSIS — N183 Chronic kidney disease, stage 3 unspecified: Secondary | ICD-10-CM | POA: Diagnosis not present

## 2019-11-10 DIAGNOSIS — N183 Chronic kidney disease, stage 3 unspecified: Secondary | ICD-10-CM | POA: Diagnosis not present

## 2019-11-10 DIAGNOSIS — E782 Mixed hyperlipidemia: Secondary | ICD-10-CM | POA: Diagnosis not present

## 2019-11-10 DIAGNOSIS — E1142 Type 2 diabetes mellitus with diabetic polyneuropathy: Secondary | ICD-10-CM | POA: Diagnosis not present

## 2019-11-10 DIAGNOSIS — E1169 Type 2 diabetes mellitus with other specified complication: Secondary | ICD-10-CM | POA: Diagnosis not present

## 2019-11-11 DIAGNOSIS — H353211 Exudative age-related macular degeneration, right eye, with active choroidal neovascularization: Secondary | ICD-10-CM | POA: Diagnosis not present

## 2019-11-11 DIAGNOSIS — H353122 Nonexudative age-related macular degeneration, left eye, intermediate dry stage: Secondary | ICD-10-CM | POA: Diagnosis not present

## 2019-11-29 DIAGNOSIS — R1310 Dysphagia, unspecified: Secondary | ICD-10-CM | POA: Diagnosis not present

## 2019-11-29 DIAGNOSIS — Z880 Allergy status to penicillin: Secondary | ICD-10-CM | POA: Diagnosis not present

## 2019-11-29 DIAGNOSIS — J9621 Acute and chronic respiratory failure with hypoxia: Secondary | ICD-10-CM

## 2019-11-29 DIAGNOSIS — J441 Chronic obstructive pulmonary disease with (acute) exacerbation: Secondary | ICD-10-CM

## 2019-11-29 DIAGNOSIS — J9601 Acute respiratory failure with hypoxia: Secondary | ICD-10-CM | POA: Diagnosis not present

## 2019-11-29 DIAGNOSIS — R739 Hyperglycemia, unspecified: Secondary | ICD-10-CM | POA: Diagnosis not present

## 2019-11-29 DIAGNOSIS — R0902 Hypoxemia: Secondary | ICD-10-CM | POA: Diagnosis not present

## 2019-11-29 DIAGNOSIS — R0602 Shortness of breath: Secondary | ICD-10-CM | POA: Diagnosis not present

## 2019-11-29 DIAGNOSIS — T380X5A Adverse effect of glucocorticoids and synthetic analogues, initial encounter: Secondary | ICD-10-CM | POA: Diagnosis not present

## 2019-11-29 DIAGNOSIS — R05 Cough: Secondary | ICD-10-CM | POA: Diagnosis not present

## 2019-11-29 DIAGNOSIS — Z79899 Other long term (current) drug therapy: Secondary | ICD-10-CM | POA: Diagnosis not present

## 2019-11-29 DIAGNOSIS — Z8673 Personal history of transient ischemic attack (TIA), and cerebral infarction without residual deficits: Secondary | ICD-10-CM | POA: Diagnosis not present

## 2019-11-29 DIAGNOSIS — E7800 Pure hypercholesterolemia, unspecified: Secondary | ICD-10-CM | POA: Diagnosis not present

## 2019-11-29 DIAGNOSIS — I10 Essential (primary) hypertension: Secondary | ICD-10-CM

## 2019-11-29 DIAGNOSIS — R509 Fever, unspecified: Secondary | ICD-10-CM | POA: Diagnosis not present

## 2019-11-29 DIAGNOSIS — D539 Nutritional anemia, unspecified: Secondary | ICD-10-CM | POA: Diagnosis not present

## 2019-12-03 DIAGNOSIS — J441 Chronic obstructive pulmonary disease with (acute) exacerbation: Secondary | ICD-10-CM | POA: Diagnosis not present

## 2019-12-03 DIAGNOSIS — J9610 Chronic respiratory failure, unspecified whether with hypoxia or hypercapnia: Secondary | ICD-10-CM | POA: Diagnosis not present

## 2019-12-10 DIAGNOSIS — J441 Chronic obstructive pulmonary disease with (acute) exacerbation: Secondary | ICD-10-CM | POA: Diagnosis not present

## 2019-12-10 DIAGNOSIS — E1169 Type 2 diabetes mellitus with other specified complication: Secondary | ICD-10-CM | POA: Diagnosis not present

## 2019-12-10 DIAGNOSIS — N183 Chronic kidney disease, stage 3 unspecified: Secondary | ICD-10-CM | POA: Diagnosis not present

## 2019-12-10 DIAGNOSIS — E782 Mixed hyperlipidemia: Secondary | ICD-10-CM | POA: Diagnosis not present

## 2019-12-17 DIAGNOSIS — M25511 Pain in right shoulder: Secondary | ICD-10-CM | POA: Diagnosis not present

## 2019-12-22 DIAGNOSIS — M25511 Pain in right shoulder: Secondary | ICD-10-CM | POA: Diagnosis not present

## 2019-12-22 DIAGNOSIS — I10 Essential (primary) hypertension: Secondary | ICD-10-CM | POA: Diagnosis not present

## 2019-12-22 DIAGNOSIS — G4700 Insomnia, unspecified: Secondary | ICD-10-CM | POA: Diagnosis not present

## 2019-12-22 DIAGNOSIS — J449 Chronic obstructive pulmonary disease, unspecified: Secondary | ICD-10-CM | POA: Diagnosis not present

## 2020-01-10 DIAGNOSIS — E782 Mixed hyperlipidemia: Secondary | ICD-10-CM | POA: Diagnosis not present

## 2020-01-10 DIAGNOSIS — E1169 Type 2 diabetes mellitus with other specified complication: Secondary | ICD-10-CM | POA: Diagnosis not present

## 2020-01-10 DIAGNOSIS — J441 Chronic obstructive pulmonary disease with (acute) exacerbation: Secondary | ICD-10-CM | POA: Diagnosis not present

## 2020-01-10 DIAGNOSIS — N183 Chronic kidney disease, stage 3 unspecified: Secondary | ICD-10-CM | POA: Diagnosis not present

## 2020-02-09 DIAGNOSIS — E782 Mixed hyperlipidemia: Secondary | ICD-10-CM | POA: Diagnosis not present

## 2020-02-09 DIAGNOSIS — J441 Chronic obstructive pulmonary disease with (acute) exacerbation: Secondary | ICD-10-CM | POA: Diagnosis not present

## 2020-02-09 DIAGNOSIS — N183 Chronic kidney disease, stage 3 unspecified: Secondary | ICD-10-CM | POA: Diagnosis not present

## 2020-02-09 DIAGNOSIS — E1169 Type 2 diabetes mellitus with other specified complication: Secondary | ICD-10-CM | POA: Diagnosis not present

## 2020-04-03 DIAGNOSIS — M549 Dorsalgia, unspecified: Secondary | ICD-10-CM | POA: Diagnosis not present

## 2020-04-03 DIAGNOSIS — E785 Hyperlipidemia, unspecified: Secondary | ICD-10-CM | POA: Diagnosis not present

## 2020-04-03 DIAGNOSIS — Z9181 History of falling: Secondary | ICD-10-CM | POA: Diagnosis not present

## 2020-04-03 DIAGNOSIS — J449 Chronic obstructive pulmonary disease, unspecified: Secondary | ICD-10-CM | POA: Diagnosis not present

## 2020-04-03 DIAGNOSIS — R739 Hyperglycemia, unspecified: Secondary | ICD-10-CM | POA: Diagnosis not present

## 2020-04-03 DIAGNOSIS — G4700 Insomnia, unspecified: Secondary | ICD-10-CM | POA: Diagnosis not present

## 2020-04-03 DIAGNOSIS — I10 Essential (primary) hypertension: Secondary | ICD-10-CM | POA: Diagnosis not present

## 2020-07-07 DIAGNOSIS — J399 Disease of upper respiratory tract, unspecified: Secondary | ICD-10-CM | POA: Diagnosis not present

## 2020-07-07 DIAGNOSIS — J441 Chronic obstructive pulmonary disease with (acute) exacerbation: Secondary | ICD-10-CM | POA: Diagnosis not present

## 2020-07-10 DIAGNOSIS — R059 Cough, unspecified: Secondary | ICD-10-CM | POA: Diagnosis not present

## 2020-07-10 DIAGNOSIS — Z79899 Other long term (current) drug therapy: Secondary | ICD-10-CM | POA: Diagnosis not present

## 2020-07-10 DIAGNOSIS — E785 Hyperlipidemia, unspecified: Secondary | ICD-10-CM | POA: Diagnosis not present

## 2020-07-10 DIAGNOSIS — I5041 Acute combined systolic (congestive) and diastolic (congestive) heart failure: Secondary | ICD-10-CM | POA: Diagnosis not present

## 2020-07-10 DIAGNOSIS — I509 Heart failure, unspecified: Secondary | ICD-10-CM | POA: Diagnosis not present

## 2020-07-10 DIAGNOSIS — J449 Chronic obstructive pulmonary disease, unspecified: Secondary | ICD-10-CM | POA: Diagnosis not present

## 2020-07-10 DIAGNOSIS — Z23 Encounter for immunization: Secondary | ICD-10-CM | POA: Diagnosis not present

## 2020-07-10 DIAGNOSIS — Z8673 Personal history of transient ischemic attack (TIA), and cerebral infarction without residual deficits: Secondary | ICD-10-CM | POA: Diagnosis not present

## 2020-07-10 DIAGNOSIS — I110 Hypertensive heart disease with heart failure: Secondary | ICD-10-CM | POA: Diagnosis not present

## 2020-07-10 DIAGNOSIS — I517 Cardiomegaly: Secondary | ICD-10-CM

## 2020-07-10 DIAGNOSIS — R0789 Other chest pain: Secondary | ICD-10-CM | POA: Diagnosis not present

## 2020-07-10 DIAGNOSIS — F32A Depression, unspecified: Secondary | ICD-10-CM | POA: Diagnosis not present

## 2020-07-14 DIAGNOSIS — Z8673 Personal history of transient ischemic attack (TIA), and cerebral infarction without residual deficits: Secondary | ICD-10-CM | POA: Diagnosis not present

## 2020-07-14 DIAGNOSIS — Z9181 History of falling: Secondary | ICD-10-CM | POA: Diagnosis not present

## 2020-07-14 DIAGNOSIS — R079 Chest pain, unspecified: Secondary | ICD-10-CM | POA: Diagnosis not present

## 2020-07-14 DIAGNOSIS — I110 Hypertensive heart disease with heart failure: Secondary | ICD-10-CM | POA: Diagnosis not present

## 2020-07-14 DIAGNOSIS — Z791 Long term (current) use of non-steroidal anti-inflammatories (NSAID): Secondary | ICD-10-CM | POA: Diagnosis not present

## 2020-07-14 DIAGNOSIS — I5041 Acute combined systolic (congestive) and diastolic (congestive) heart failure: Secondary | ICD-10-CM | POA: Diagnosis not present

## 2020-07-14 DIAGNOSIS — J449 Chronic obstructive pulmonary disease, unspecified: Secondary | ICD-10-CM | POA: Diagnosis not present

## 2020-07-14 DIAGNOSIS — F32A Depression, unspecified: Secondary | ICD-10-CM | POA: Diagnosis not present

## 2020-07-14 DIAGNOSIS — Z7951 Long term (current) use of inhaled steroids: Secondary | ICD-10-CM | POA: Diagnosis not present

## 2020-07-14 DIAGNOSIS — E785 Hyperlipidemia, unspecified: Secondary | ICD-10-CM | POA: Diagnosis not present

## 2020-07-16 DIAGNOSIS — J449 Chronic obstructive pulmonary disease, unspecified: Secondary | ICD-10-CM | POA: Diagnosis not present

## 2020-07-16 DIAGNOSIS — R079 Chest pain, unspecified: Secondary | ICD-10-CM | POA: Diagnosis not present

## 2020-07-16 DIAGNOSIS — Z7951 Long term (current) use of inhaled steroids: Secondary | ICD-10-CM | POA: Diagnosis not present

## 2020-07-16 DIAGNOSIS — Z791 Long term (current) use of non-steroidal anti-inflammatories (NSAID): Secondary | ICD-10-CM | POA: Diagnosis not present

## 2020-07-16 DIAGNOSIS — Z8673 Personal history of transient ischemic attack (TIA), and cerebral infarction without residual deficits: Secondary | ICD-10-CM | POA: Diagnosis not present

## 2020-07-16 DIAGNOSIS — I5041 Acute combined systolic (congestive) and diastolic (congestive) heart failure: Secondary | ICD-10-CM | POA: Diagnosis not present

## 2020-07-16 DIAGNOSIS — Z9181 History of falling: Secondary | ICD-10-CM | POA: Diagnosis not present

## 2020-07-16 DIAGNOSIS — E785 Hyperlipidemia, unspecified: Secondary | ICD-10-CM | POA: Diagnosis not present

## 2020-07-16 DIAGNOSIS — I110 Hypertensive heart disease with heart failure: Secondary | ICD-10-CM | POA: Diagnosis not present

## 2020-07-16 DIAGNOSIS — F32A Depression, unspecified: Secondary | ICD-10-CM | POA: Diagnosis not present

## 2020-07-18 DIAGNOSIS — Z791 Long term (current) use of non-steroidal anti-inflammatories (NSAID): Secondary | ICD-10-CM | POA: Diagnosis not present

## 2020-07-18 DIAGNOSIS — Z9181 History of falling: Secondary | ICD-10-CM | POA: Diagnosis not present

## 2020-07-18 DIAGNOSIS — I5041 Acute combined systolic (congestive) and diastolic (congestive) heart failure: Secondary | ICD-10-CM | POA: Diagnosis not present

## 2020-07-18 DIAGNOSIS — Z8673 Personal history of transient ischemic attack (TIA), and cerebral infarction without residual deficits: Secondary | ICD-10-CM | POA: Diagnosis not present

## 2020-07-18 DIAGNOSIS — F32A Depression, unspecified: Secondary | ICD-10-CM | POA: Diagnosis not present

## 2020-07-18 DIAGNOSIS — I110 Hypertensive heart disease with heart failure: Secondary | ICD-10-CM | POA: Diagnosis not present

## 2020-07-18 DIAGNOSIS — Z7951 Long term (current) use of inhaled steroids: Secondary | ICD-10-CM | POA: Diagnosis not present

## 2020-07-18 DIAGNOSIS — R079 Chest pain, unspecified: Secondary | ICD-10-CM | POA: Diagnosis not present

## 2020-07-18 DIAGNOSIS — J449 Chronic obstructive pulmonary disease, unspecified: Secondary | ICD-10-CM | POA: Diagnosis not present

## 2020-07-18 DIAGNOSIS — E785 Hyperlipidemia, unspecified: Secondary | ICD-10-CM | POA: Diagnosis not present

## 2020-07-19 DIAGNOSIS — Z139 Encounter for screening, unspecified: Secondary | ICD-10-CM | POA: Diagnosis not present

## 2020-07-19 DIAGNOSIS — I10 Essential (primary) hypertension: Secondary | ICD-10-CM | POA: Diagnosis not present

## 2020-07-19 DIAGNOSIS — R6884 Jaw pain: Secondary | ICD-10-CM | POA: Diagnosis not present

## 2020-07-19 DIAGNOSIS — I509 Heart failure, unspecified: Secondary | ICD-10-CM | POA: Diagnosis not present

## 2020-07-21 DIAGNOSIS — Z791 Long term (current) use of non-steroidal anti-inflammatories (NSAID): Secondary | ICD-10-CM | POA: Diagnosis not present

## 2020-07-21 DIAGNOSIS — R079 Chest pain, unspecified: Secondary | ICD-10-CM | POA: Diagnosis not present

## 2020-07-21 DIAGNOSIS — F32A Depression, unspecified: Secondary | ICD-10-CM | POA: Diagnosis not present

## 2020-07-21 DIAGNOSIS — E785 Hyperlipidemia, unspecified: Secondary | ICD-10-CM | POA: Diagnosis not present

## 2020-07-21 DIAGNOSIS — J449 Chronic obstructive pulmonary disease, unspecified: Secondary | ICD-10-CM | POA: Diagnosis not present

## 2020-07-21 DIAGNOSIS — Z9181 History of falling: Secondary | ICD-10-CM | POA: Diagnosis not present

## 2020-07-21 DIAGNOSIS — I110 Hypertensive heart disease with heart failure: Secondary | ICD-10-CM | POA: Diagnosis not present

## 2020-07-21 DIAGNOSIS — Z8673 Personal history of transient ischemic attack (TIA), and cerebral infarction without residual deficits: Secondary | ICD-10-CM | POA: Diagnosis not present

## 2020-07-21 DIAGNOSIS — Z7951 Long term (current) use of inhaled steroids: Secondary | ICD-10-CM | POA: Diagnosis not present

## 2020-07-21 DIAGNOSIS — I5041 Acute combined systolic (congestive) and diastolic (congestive) heart failure: Secondary | ICD-10-CM | POA: Diagnosis not present

## 2020-07-24 ENCOUNTER — Other Ambulatory Visit: Payer: Self-pay

## 2020-07-24 DIAGNOSIS — I509 Heart failure, unspecified: Secondary | ICD-10-CM | POA: Insufficient documentation

## 2020-07-25 ENCOUNTER — Ambulatory Visit: Payer: Medicare Other | Admitting: Cardiology

## 2020-07-25 ENCOUNTER — Encounter: Payer: Self-pay | Admitting: Cardiology

## 2020-07-25 ENCOUNTER — Other Ambulatory Visit: Payer: Self-pay

## 2020-07-25 VITALS — BP 162/86 | HR 68 | Ht 63.0 in | Wt 183.0 lb

## 2020-07-25 DIAGNOSIS — R0789 Other chest pain: Secondary | ICD-10-CM

## 2020-07-25 DIAGNOSIS — I10 Essential (primary) hypertension: Secondary | ICD-10-CM | POA: Insufficient documentation

## 2020-07-25 DIAGNOSIS — E785 Hyperlipidemia, unspecified: Secondary | ICD-10-CM | POA: Insufficient documentation

## 2020-07-25 DIAGNOSIS — I5042 Chronic combined systolic (congestive) and diastolic (congestive) heart failure: Secondary | ICD-10-CM

## 2020-07-25 HISTORY — DX: Essential (primary) hypertension: I10

## 2020-07-25 HISTORY — DX: Hyperlipidemia, unspecified: E78.5

## 2020-07-25 HISTORY — DX: Other chest pain: R07.89

## 2020-07-25 MED ORDER — AMLODIPINE BESYLATE 5 MG PO TABS: 5.0000 mg | ORAL_TABLET | Freq: Every day | ORAL | 1 refills | Status: DC

## 2020-07-25 NOTE — Patient Instructions (Signed)
Medication Instructions:  Your physician has recommended you make the following change in your medication:   START: Amlodipine 5 mg daily   *If you need a refill on your cardiac medications before your next appointment, please call your pharmacy*   Lab Work: None If you have labs (blood work) drawn today and your tests are completely normal, you will receive your results only by: Marland Kitchen MyChart Message (if you have MyChart) OR . A paper copy in the mail If you have any lab test that is abnormal or we need to change your treatment, we will call you to review the results.   Testing/Procedures: None.   Follow-Up: At [facility], you and your health needs are our priority.  As part of our continuing mission to provide you with exceptional heart care, we have created designated Provider Care Teams.  These Care Teams include your primary Cardiologist (physician) and Advanced Practice Providers (APPs -  Physician Assistants and Nurse Practitioners) who all work together to provide you with the care you need, when you need it.  We recommend signing up for the patient portal called "MyChart".  Sign up information is provided on this After Visit Summary.  MyChart is used to connect with patients for Virtual Visits (Telemedicine).  Patients are able to view lab/test results, encounter notes, upcoming appointments, etc.  Non-urgent messages can be sent to your provider as well.   To learn more about what you can do with MyChart, go to [email or web address].    Your next appointment:   1 month(s)  The format for your next appointment:   In Person  Provider:   Jenne Campus, MD   Other Instructions

## 2020-07-25 NOTE — Progress Notes (Signed)
Cardiology Consultation:    Date:  07/25/2020   ID:  Alain Honey, DOB Sep 21, 1922, MRN [id number]  PCP:  Renaldo Reel, PA  Cardiologist:  Jenne Campus, MD   Referring MD: Renaldo Reel, [place]   Chief Complaint  Patient presents with  . Shortness of Breath       . Hypertension  . Jaw Pain    History of Present Illness:    Joan Carr is a 86 y.o. female who is being seen today for the evaluation of hypertension at the request of Renaldo Reel, [place]. Recently she ended up being in the hospital because of shortness of breath. She was find to be in decompensated congestive heart failure that was felt to be related to systolic dysfunction with her echocardiogram showing ejection fraction 45 to 50%, there was also diastolic dysfunction, as well as high blood pressure. Also she was taking high dosages of verapamil. All this issue has been corrected she was given diuretic and discharged home with good response. She was also complaining of having some pleuritic chest pain however all tests were negative. She comes today to my office to be establish as a patient for management of congestive heart failure. She has had shortness of breath dyspnea. There is no swelling of lower extremities but she complained of being weak tired and exhausted. She said this is sensation she has been having for the last 6 months. Normal chest pain no palpitation she get dizzy sometimes when she gets up or when she turns her head very quickly.  Past Medical History:  Diagnosis Date  . CHF (congestive heart failure) ([place])   . Diabetes ([place])    hx DM but undercontrolled  . Hypertension   . Stroke (cerebrum) [facility])     Past Surgical History:  Procedure Laterality Date  . APPENDECTOMY    . BACK SURGERY    . Ovary removed  Right   . WRIST FRACTURE SURGERY Right     Current Medications: Current Meds  Medication Sig  . carvedilol (COREG) 6.25 MG tablet Take 6.25 mg by mouth 2 (two) times daily.  .  furosemide (LASIX) 20 MG tablet Take 20 mg by mouth daily.  Marland Kitchen losartan (COZAAR) 100 MG tablet Take 100 mg by mouth daily.  . traMADol (ULTRAM) 50 MG tablet Take 50 mg by mouth every 4 (four) hours as needed.  . traZODone (DESYREL) 100 MG tablet Take 100 mg by mouth at bedtime.  Viviana Simpler ELLIPTA 200-62.5-25 MCG/INH AEPB Inhale 1 puff into the lungs daily.     Allergies:   Penicillins   Social History   Socioeconomic History  . Marital status: Unknown    Spouse name: Not on file  . Number of children: Not on file  . Years of education: Not on file  . Highest education level: Not on file  Occupational History  . Not on file  Tobacco Use  . Smoking status: Former Research scientist (life sciences)  . Smokeless tobacco: Never Used  Substance and Sexual Activity  . Alcohol use: Not on file  . Drug use: Not on file  . Sexual activity: Not on file  Other Topics Concern  . Not on file  Social History Narrative  . Not on file   Social Determinants of Health   Financial Resource Strain: Not on file  Food Insecurity: Not on file  Transportation Needs: Not on file  Physical Activity: Not on file  Stress: Not on file  Social Connections: Not  on file     Family History: The patient's family history includes Cancer in her brother, father, and sister; Heart attack in her brother; Heart disease in her sister. ROS:   Please see the history of present illness.    All 14 point review of systems negative except as described per history of present illness.  EKGs/Labs/Other Studies Reviewed:    The following studies were reviewed today: EKG done in the hospital is sinus rhythm with occasional PVCs, cannot rule out anterior septal wall myocardial infarction, ST segment changes abnormality which are nonspecific.    Recent Labs: No results found for requested labs within last 8760 hours.  Recent Lipid Panel No results found for: CHOL, TRIG, HDL, CHOLHDL, VLDL, LDLCALC, LDLDIRECT  Physical Exam:    VS:  BP (!)  162/86 (BP Location: Left Arm, Patient Position: Sitting)   Pulse 68   Ht 5\' 3"  (1.6 m)   Wt 183 lb (83 kg)   SpO2 94%   BMI 32.42 kg/m     Wt Readings from Last 3 Encounters:  07/25/20 183 lb (83 kg)     GEN:  Well nourished, well developed in no acute distress HEENT: Normal NECK: No JVD; No carotid bruits LYMPHATICS: No lymphadenopathy CARDIAC: RRR, no murmurs, no rubs, no gallops RESPIRATORY:  Clear to auscultation without rales, wheezing or rhonchi  ABDOMEN: Soft, non-tender, non-distended MUSCULOSKELETAL:  No edema; No deformity  SKIN: Warm and dry NEUROLOGIC:  Alert and oriented x 3 PSYCHIATRIC:  Normal affect   ASSESSMENT:    1. Chronic combined systolic and diastolic congestive heart failure ([place])   2. Essential hypertension   3. Atypical chest pain   4. Dyslipidemia    PLAN:    In order of problems listed above:  1. Chronic combined systolic and diastolic congestive heart failure. Clinically she appears to be compensated. However complain of having weakness fatigue and tiredness. I will call primary care physician to check her Chem-7 which was checked just a week ago. Another issue is still ongoing is the fact that her blood pressures not well controlled which of course contribute to her symptomatology and congestive heart failure. 2. Essential hypertension she is on ARB as well as beta-blocker which I will continue, I will ask her to start taking amlodipine 5 mg daily I warned her about potential side effects with swelling of lower extremities. 3. Atypical chest pain: Denies having any. 4. Dyslipidemia I do have her K PN from 04/03/2020 LDL is 160 and HDL 46 I think the benefits of statin therapy in somebody who is 95% was questionable. Will continue discussion about potentially initiation some treatment.   Medication Adjustments/Labs and Tests Ordered: Current medicines are reviewed at length with the patient today.  Concerns regarding medicines are outlined above.   No orders of the defined types were placed in this encounter.  No orders of the defined types were placed in this encounter.   Signed, Park Liter, MD, [facility]. 07/25/2020 1:49 PM    [facility]

## 2020-07-26 DIAGNOSIS — E785 Hyperlipidemia, unspecified: Secondary | ICD-10-CM | POA: Diagnosis not present

## 2020-07-26 DIAGNOSIS — Z8673 Personal history of transient ischemic attack (TIA), and cerebral infarction without residual deficits: Secondary | ICD-10-CM | POA: Diagnosis not present

## 2020-07-26 DIAGNOSIS — I5041 Acute combined systolic (congestive) and diastolic (congestive) heart failure: Secondary | ICD-10-CM | POA: Diagnosis not present

## 2020-07-26 DIAGNOSIS — I110 Hypertensive heart disease with heart failure: Secondary | ICD-10-CM | POA: Diagnosis not present

## 2020-07-26 DIAGNOSIS — Z791 Long term (current) use of non-steroidal anti-inflammatories (NSAID): Secondary | ICD-10-CM | POA: Diagnosis not present

## 2020-07-26 DIAGNOSIS — Z9181 History of falling: Secondary | ICD-10-CM | POA: Diagnosis not present

## 2020-07-26 DIAGNOSIS — F32A Depression, unspecified: Secondary | ICD-10-CM | POA: Diagnosis not present

## 2020-07-26 DIAGNOSIS — Z7951 Long term (current) use of inhaled steroids: Secondary | ICD-10-CM | POA: Diagnosis not present

## 2020-07-26 DIAGNOSIS — R079 Chest pain, unspecified: Secondary | ICD-10-CM | POA: Diagnosis not present

## 2020-07-26 DIAGNOSIS — J449 Chronic obstructive pulmonary disease, unspecified: Secondary | ICD-10-CM | POA: Diagnosis not present

## 2020-07-28 DIAGNOSIS — I5041 Acute combined systolic (congestive) and diastolic (congestive) heart failure: Secondary | ICD-10-CM | POA: Diagnosis not present

## 2020-07-28 DIAGNOSIS — E785 Hyperlipidemia, unspecified: Secondary | ICD-10-CM | POA: Diagnosis not present

## 2020-07-28 DIAGNOSIS — I110 Hypertensive heart disease with heart failure: Secondary | ICD-10-CM | POA: Diagnosis not present

## 2020-07-28 DIAGNOSIS — Z791 Long term (current) use of non-steroidal anti-inflammatories (NSAID): Secondary | ICD-10-CM | POA: Diagnosis not present

## 2020-07-28 DIAGNOSIS — Z8673 Personal history of transient ischemic attack (TIA), and cerebral infarction without residual deficits: Secondary | ICD-10-CM | POA: Diagnosis not present

## 2020-07-28 DIAGNOSIS — Z7951 Long term (current) use of inhaled steroids: Secondary | ICD-10-CM | POA: Diagnosis not present

## 2020-07-28 DIAGNOSIS — Z9181 History of falling: Secondary | ICD-10-CM | POA: Diagnosis not present

## 2020-07-28 DIAGNOSIS — R079 Chest pain, unspecified: Secondary | ICD-10-CM | POA: Diagnosis not present

## 2020-07-28 DIAGNOSIS — F32A Depression, unspecified: Secondary | ICD-10-CM | POA: Diagnosis not present

## 2020-07-28 DIAGNOSIS — J449 Chronic obstructive pulmonary disease, unspecified: Secondary | ICD-10-CM | POA: Diagnosis not present

## 2020-08-02 DIAGNOSIS — E785 Hyperlipidemia, unspecified: Secondary | ICD-10-CM | POA: Diagnosis not present

## 2020-08-02 DIAGNOSIS — Z8673 Personal history of transient ischemic attack (TIA), and cerebral infarction without residual deficits: Secondary | ICD-10-CM | POA: Diagnosis not present

## 2020-08-02 DIAGNOSIS — Z791 Long term (current) use of non-steroidal anti-inflammatories (NSAID): Secondary | ICD-10-CM | POA: Diagnosis not present

## 2020-08-02 DIAGNOSIS — Z9181 History of falling: Secondary | ICD-10-CM | POA: Diagnosis not present

## 2020-08-02 DIAGNOSIS — F32A Depression, unspecified: Secondary | ICD-10-CM | POA: Diagnosis not present

## 2020-08-02 DIAGNOSIS — I5041 Acute combined systolic (congestive) and diastolic (congestive) heart failure: Secondary | ICD-10-CM | POA: Diagnosis not present

## 2020-08-02 DIAGNOSIS — R079 Chest pain, unspecified: Secondary | ICD-10-CM | POA: Diagnosis not present

## 2020-08-02 DIAGNOSIS — Z7951 Long term (current) use of inhaled steroids: Secondary | ICD-10-CM | POA: Diagnosis not present

## 2020-08-02 DIAGNOSIS — I110 Hypertensive heart disease with heart failure: Secondary | ICD-10-CM | POA: Diagnosis not present

## 2020-08-02 DIAGNOSIS — J449 Chronic obstructive pulmonary disease, unspecified: Secondary | ICD-10-CM | POA: Diagnosis not present

## 2020-08-09 DIAGNOSIS — Z9181 History of falling: Secondary | ICD-10-CM | POA: Diagnosis not present

## 2020-08-09 DIAGNOSIS — I110 Hypertensive heart disease with heart failure: Secondary | ICD-10-CM | POA: Diagnosis not present

## 2020-08-09 DIAGNOSIS — R079 Chest pain, unspecified: Secondary | ICD-10-CM | POA: Diagnosis not present

## 2020-08-09 DIAGNOSIS — F32A Depression, unspecified: Secondary | ICD-10-CM | POA: Diagnosis not present

## 2020-08-09 DIAGNOSIS — Z7951 Long term (current) use of inhaled steroids: Secondary | ICD-10-CM | POA: Diagnosis not present

## 2020-08-09 DIAGNOSIS — Z8673 Personal history of transient ischemic attack (TIA), and cerebral infarction without residual deficits: Secondary | ICD-10-CM | POA: Diagnosis not present

## 2020-08-09 DIAGNOSIS — I5041 Acute combined systolic (congestive) and diastolic (congestive) heart failure: Secondary | ICD-10-CM | POA: Diagnosis not present

## 2020-08-09 DIAGNOSIS — E785 Hyperlipidemia, unspecified: Secondary | ICD-10-CM | POA: Diagnosis not present

## 2020-08-09 DIAGNOSIS — J449 Chronic obstructive pulmonary disease, unspecified: Secondary | ICD-10-CM | POA: Diagnosis not present

## 2020-08-09 DIAGNOSIS — Z791 Long term (current) use of non-steroidal anti-inflammatories (NSAID): Secondary | ICD-10-CM | POA: Diagnosis not present

## 2020-08-17 DIAGNOSIS — H353211 Exudative age-related macular degeneration, right eye, with active choroidal neovascularization: Secondary | ICD-10-CM | POA: Diagnosis not present

## 2020-08-18 DIAGNOSIS — I110 Hypertensive heart disease with heart failure: Secondary | ICD-10-CM | POA: Diagnosis not present

## 2020-08-18 DIAGNOSIS — F32A Depression, unspecified: Secondary | ICD-10-CM | POA: Diagnosis not present

## 2020-08-18 DIAGNOSIS — R079 Chest pain, unspecified: Secondary | ICD-10-CM | POA: Diagnosis not present

## 2020-08-18 DIAGNOSIS — Z791 Long term (current) use of non-steroidal anti-inflammatories (NSAID): Secondary | ICD-10-CM | POA: Diagnosis not present

## 2020-08-18 DIAGNOSIS — I5041 Acute combined systolic (congestive) and diastolic (congestive) heart failure: Secondary | ICD-10-CM | POA: Diagnosis not present

## 2020-08-18 DIAGNOSIS — Z9181 History of falling: Secondary | ICD-10-CM | POA: Diagnosis not present

## 2020-08-18 DIAGNOSIS — Z8673 Personal history of transient ischemic attack (TIA), and cerebral infarction without residual deficits: Secondary | ICD-10-CM | POA: Diagnosis not present

## 2020-08-18 DIAGNOSIS — Z7951 Long term (current) use of inhaled steroids: Secondary | ICD-10-CM | POA: Diagnosis not present

## 2020-08-18 DIAGNOSIS — J449 Chronic obstructive pulmonary disease, unspecified: Secondary | ICD-10-CM | POA: Diagnosis not present

## 2020-08-18 DIAGNOSIS — E785 Hyperlipidemia, unspecified: Secondary | ICD-10-CM | POA: Diagnosis not present

## 2020-08-23 DIAGNOSIS — Z8673 Personal history of transient ischemic attack (TIA), and cerebral infarction without residual deficits: Secondary | ICD-10-CM | POA: Diagnosis not present

## 2020-08-23 DIAGNOSIS — F32A Depression, unspecified: Secondary | ICD-10-CM | POA: Diagnosis not present

## 2020-08-23 DIAGNOSIS — Z9181 History of falling: Secondary | ICD-10-CM | POA: Diagnosis not present

## 2020-08-23 DIAGNOSIS — Z791 Long term (current) use of non-steroidal anti-inflammatories (NSAID): Secondary | ICD-10-CM | POA: Diagnosis not present

## 2020-08-23 DIAGNOSIS — R079 Chest pain, unspecified: Secondary | ICD-10-CM | POA: Diagnosis not present

## 2020-08-23 DIAGNOSIS — Z7951 Long term (current) use of inhaled steroids: Secondary | ICD-10-CM | POA: Diagnosis not present

## 2020-08-23 DIAGNOSIS — I110 Hypertensive heart disease with heart failure: Secondary | ICD-10-CM | POA: Diagnosis not present

## 2020-08-23 DIAGNOSIS — J449 Chronic obstructive pulmonary disease, unspecified: Secondary | ICD-10-CM | POA: Diagnosis not present

## 2020-08-23 DIAGNOSIS — I5041 Acute combined systolic (congestive) and diastolic (congestive) heart failure: Secondary | ICD-10-CM | POA: Diagnosis not present

## 2020-08-23 DIAGNOSIS — E785 Hyperlipidemia, unspecified: Secondary | ICD-10-CM | POA: Diagnosis not present

## 2020-08-24 DIAGNOSIS — Z139 Encounter for screening, unspecified: Secondary | ICD-10-CM | POA: Diagnosis not present

## 2020-08-24 DIAGNOSIS — I509 Heart failure, unspecified: Secondary | ICD-10-CM | POA: Diagnosis not present

## 2020-08-24 DIAGNOSIS — R6884 Jaw pain: Secondary | ICD-10-CM | POA: Diagnosis not present

## 2020-08-24 DIAGNOSIS — I10 Essential (primary) hypertension: Secondary | ICD-10-CM | POA: Diagnosis not present

## 2020-08-25 DIAGNOSIS — I639 Cerebral infarction, unspecified: Secondary | ICD-10-CM | POA: Insufficient documentation

## 2020-08-25 DIAGNOSIS — E119 Type 2 diabetes mellitus without complications: Secondary | ICD-10-CM | POA: Insufficient documentation

## 2020-08-25 DIAGNOSIS — I10 Essential (primary) hypertension: Secondary | ICD-10-CM | POA: Insufficient documentation

## 2020-08-29 ENCOUNTER — Ambulatory Visit: Payer: Medicare Other | Admitting: Cardiology

## 2020-09-01 DIAGNOSIS — Z7951 Long term (current) use of inhaled steroids: Secondary | ICD-10-CM | POA: Diagnosis not present

## 2020-09-01 DIAGNOSIS — J449 Chronic obstructive pulmonary disease, unspecified: Secondary | ICD-10-CM | POA: Diagnosis not present

## 2020-09-01 DIAGNOSIS — R079 Chest pain, unspecified: Secondary | ICD-10-CM | POA: Diagnosis not present

## 2020-09-01 DIAGNOSIS — Z9181 History of falling: Secondary | ICD-10-CM | POA: Diagnosis not present

## 2020-09-01 DIAGNOSIS — I110 Hypertensive heart disease with heart failure: Secondary | ICD-10-CM | POA: Diagnosis not present

## 2020-09-01 DIAGNOSIS — Z8673 Personal history of transient ischemic attack (TIA), and cerebral infarction without residual deficits: Secondary | ICD-10-CM | POA: Diagnosis not present

## 2020-09-01 DIAGNOSIS — Z791 Long term (current) use of non-steroidal anti-inflammatories (NSAID): Secondary | ICD-10-CM | POA: Diagnosis not present

## 2020-09-01 DIAGNOSIS — F32A Depression, unspecified: Secondary | ICD-10-CM | POA: Diagnosis not present

## 2020-09-01 DIAGNOSIS — I5041 Acute combined systolic (congestive) and diastolic (congestive) heart failure: Secondary | ICD-10-CM | POA: Diagnosis not present

## 2020-09-01 DIAGNOSIS — E785 Hyperlipidemia, unspecified: Secondary | ICD-10-CM | POA: Diagnosis not present

## 2020-09-28 DIAGNOSIS — H353122 Nonexudative age-related macular degeneration, left eye, intermediate dry stage: Secondary | ICD-10-CM | POA: Diagnosis not present

## 2020-09-28 DIAGNOSIS — H353211 Exudative age-related macular degeneration, right eye, with active choroidal neovascularization: Secondary | ICD-10-CM | POA: Diagnosis not present

## 2020-10-05 DIAGNOSIS — J219 Acute bronchiolitis, unspecified: Secondary | ICD-10-CM | POA: Diagnosis not present

## 2020-10-05 DIAGNOSIS — R079 Chest pain, unspecified: Secondary | ICD-10-CM | POA: Diagnosis not present

## 2020-10-05 DIAGNOSIS — I517 Cardiomegaly: Secondary | ICD-10-CM | POA: Diagnosis not present

## 2020-10-05 DIAGNOSIS — R0789 Other chest pain: Secondary | ICD-10-CM | POA: Diagnosis not present

## 2020-10-05 DIAGNOSIS — R059 Cough, unspecified: Secondary | ICD-10-CM | POA: Diagnosis not present

## 2020-10-11 DIAGNOSIS — R071 Chest pain on breathing: Secondary | ICD-10-CM | POA: Diagnosis not present

## 2020-10-11 DIAGNOSIS — R0789 Other chest pain: Secondary | ICD-10-CM | POA: Diagnosis not present

## 2020-10-11 DIAGNOSIS — I509 Heart failure, unspecified: Secondary | ICD-10-CM | POA: Diagnosis not present

## 2020-10-31 ENCOUNTER — Other Ambulatory Visit: Payer: Self-pay | Admitting: Cardiology

## 2020-11-29 DIAGNOSIS — J441 Chronic obstructive pulmonary disease with (acute) exacerbation: Secondary | ICD-10-CM | POA: Diagnosis not present

## 2020-11-29 DIAGNOSIS — J399 Disease of upper respiratory tract, unspecified: Secondary | ICD-10-CM | POA: Diagnosis not present

## 2020-12-27 DIAGNOSIS — Z9181 History of falling: Secondary | ICD-10-CM | POA: Diagnosis not present

## 2020-12-27 DIAGNOSIS — Z0000 Encounter for general adult medical examination without abnormal findings: Secondary | ICD-10-CM | POA: Diagnosis not present

## 2020-12-27 DIAGNOSIS — E785 Hyperlipidemia, unspecified: Secondary | ICD-10-CM | POA: Diagnosis not present

## 2021-01-04 DIAGNOSIS — H353122 Nonexudative age-related macular degeneration, left eye, intermediate dry stage: Secondary | ICD-10-CM | POA: Diagnosis not present

## 2021-01-04 DIAGNOSIS — H353211 Exudative age-related macular degeneration, right eye, with active choroidal neovascularization: Secondary | ICD-10-CM | POA: Diagnosis not present

## 2021-01-09 DIAGNOSIS — D485 Neoplasm of uncertain behavior of skin: Secondary | ICD-10-CM | POA: Diagnosis not present

## 2021-01-09 DIAGNOSIS — L820 Inflamed seborrheic keratosis: Secondary | ICD-10-CM | POA: Diagnosis not present

## 2021-01-09 DIAGNOSIS — D2261 Melanocytic nevi of right upper limb, including shoulder: Secondary | ICD-10-CM | POA: Diagnosis not present

## 2021-01-09 DIAGNOSIS — D2262 Melanocytic nevi of left upper limb, including shoulder: Secondary | ICD-10-CM | POA: Diagnosis not present

## 2021-02-21 ENCOUNTER — Telehealth: Payer: Self-pay | Admitting: Cardiology

## 2021-02-21 MED ORDER — LOSARTAN POTASSIUM 100 MG PO TABS: 100.0000 mg | ORAL_TABLET | Freq: Every day | ORAL | 0 refills | Status: DC

## 2021-02-21 MED ORDER — CARVEDILOL 6.25 MG PO TABS
6.2500 mg | ORAL_TABLET | Freq: Two times a day (BID) | ORAL | 0 refills | Status: AC
Start: 2021-02-21 — End: ?

## 2021-02-21 MED ORDER — AMLODIPINE BESYLATE 5 MG PO TABS: 5.0000 mg | ORAL_TABLET | Freq: Every day | ORAL | 0 refills | Status: AC

## 2021-02-21 MED ORDER — FUROSEMIDE 20 MG PO TABS: 20.0000 mg | ORAL_TABLET | Freq: Every day | ORAL | 0 refills | Status: DC

## 2021-02-21 NOTE — Telephone Encounter (Signed)
Refill of Amlodipine 5 mg, Carvedilol 6.25 mg, Losartan 100 mg, Furosemide 20 mg sent to [facility] on [address]

## 2021-02-21 NOTE — Telephone Encounter (Signed)
Patient needs refill on all medication sent to [facility] on [address].  Amlodipine Carvedilol Lasix cozar

## 2021-03-08 ENCOUNTER — Other Ambulatory Visit: Payer: Self-pay | Admitting: Cardiology

## 2021-03-08 DIAGNOSIS — J399 Disease of upper respiratory tract, unspecified: Secondary | ICD-10-CM | POA: Diagnosis not present

## 2021-03-08 DIAGNOSIS — R739 Hyperglycemia, unspecified: Secondary | ICD-10-CM | POA: Diagnosis not present

## 2021-03-08 DIAGNOSIS — Z9181 History of falling: Secondary | ICD-10-CM | POA: Diagnosis not present

## 2021-03-08 DIAGNOSIS — E785 Hyperlipidemia, unspecified: Secondary | ICD-10-CM | POA: Diagnosis not present

## 2021-03-08 DIAGNOSIS — R0600 Dyspnea, unspecified: Secondary | ICD-10-CM | POA: Diagnosis not present

## 2021-03-08 DIAGNOSIS — G4700 Insomnia, unspecified: Secondary | ICD-10-CM | POA: Diagnosis not present

## 2021-03-08 DIAGNOSIS — J441 Chronic obstructive pulmonary disease with (acute) exacerbation: Secondary | ICD-10-CM | POA: Diagnosis not present

## 2021-03-08 DIAGNOSIS — I10 Essential (primary) hypertension: Secondary | ICD-10-CM | POA: Diagnosis not present

## 2021-04-10 DIAGNOSIS — C44629 Squamous cell carcinoma of skin of left upper limb, including shoulder: Secondary | ICD-10-CM | POA: Diagnosis not present

## 2021-04-17 DIAGNOSIS — N289 Disorder of kidney and ureter, unspecified: Secondary | ICD-10-CM | POA: Diagnosis not present

## 2021-05-25 DIAGNOSIS — I10 Essential (primary) hypertension: Secondary | ICD-10-CM | POA: Diagnosis not present

## 2021-05-25 DIAGNOSIS — R42 Dizziness and giddiness: Secondary | ICD-10-CM | POA: Diagnosis not present

## 2021-05-28 DIAGNOSIS — R4781 Slurred speech: Secondary | ICD-10-CM | POA: Diagnosis not present

## 2021-05-28 DIAGNOSIS — R6889 Other general symptoms and signs: Secondary | ICD-10-CM | POA: Diagnosis not present

## 2021-05-28 DIAGNOSIS — Z743 Need for continuous supervision: Secondary | ICD-10-CM | POA: Diagnosis not present

## 2021-05-28 DIAGNOSIS — R404 Transient alteration of awareness: Secondary | ICD-10-CM | POA: Diagnosis not present

## 2021-05-28 DIAGNOSIS — R29810 Facial weakness: Secondary | ICD-10-CM | POA: Diagnosis not present

## 2021-05-29 ENCOUNTER — Encounter (HOSPITAL_COMMUNITY): Payer: Self-pay

## 2021-05-29 ENCOUNTER — Emergency Department (HOSPITAL_COMMUNITY): Payer: Medicare Other

## 2021-05-29 ENCOUNTER — Observation Stay (HOSPITAL_COMMUNITY): Payer: Medicare Other

## 2021-05-29 ENCOUNTER — Inpatient Hospital Stay (HOSPITAL_COMMUNITY)
Admission: EM | Admit: 2021-05-29 | Discharge: 2021-06-02 | DRG: 064 | Disposition: A | Discharge status: Skilled Nursing Facility | Payer: Medicare Other | Attending: Internal Medicine | Admitting: Internal Medicine

## 2021-05-29 DIAGNOSIS — R471 Dysarthria and anarthria: Secondary | ICD-10-CM | POA: Diagnosis not present

## 2021-05-29 DIAGNOSIS — Z9049 Acquired absence of other specified parts of digestive tract: Secondary | ICD-10-CM

## 2021-05-29 DIAGNOSIS — F0390 Unspecified dementia without behavioral disturbance: Secondary | ICD-10-CM | POA: Diagnosis present

## 2021-05-29 DIAGNOSIS — Z880 Allergy status to penicillin: Secondary | ICD-10-CM | POA: Diagnosis not present

## 2021-05-29 DIAGNOSIS — I6389 Other cerebral infarction: Secondary | ICD-10-CM

## 2021-05-29 DIAGNOSIS — I6622 Occlusion and stenosis of left posterior cerebral artery: Secondary | ICD-10-CM | POA: Diagnosis not present

## 2021-05-29 DIAGNOSIS — I69392 Facial weakness following cerebral infarction: Secondary | ICD-10-CM

## 2021-05-29 DIAGNOSIS — I69951 Hemiplegia and hemiparesis following unspecified cerebrovascular disease affecting right dominant side: Secondary | ICD-10-CM | POA: Diagnosis not present

## 2021-05-29 DIAGNOSIS — R29810 Facial weakness: Secondary | ICD-10-CM | POA: Diagnosis not present

## 2021-05-29 DIAGNOSIS — I69322 Dysarthria following cerebral infarction: Secondary | ICD-10-CM | POA: Diagnosis not present

## 2021-05-29 DIAGNOSIS — I6930 Unspecified sequelae of cerebral infarction: Secondary | ICD-10-CM

## 2021-05-29 DIAGNOSIS — I63512 Cerebral infarction due to unspecified occlusion or stenosis of left middle cerebral artery: Principal | ICD-10-CM

## 2021-05-29 DIAGNOSIS — I10 Essential (primary) hypertension: Secondary | ICD-10-CM

## 2021-05-29 DIAGNOSIS — I639 Cerebral infarction, unspecified: Secondary | ICD-10-CM | POA: Diagnosis not present

## 2021-05-29 DIAGNOSIS — I69320 Aphasia following cerebral infarction: Secondary | ICD-10-CM | POA: Diagnosis not present

## 2021-05-29 DIAGNOSIS — R29702 NIHSS score 2: Secondary | ICD-10-CM | POA: Diagnosis not present

## 2021-05-29 DIAGNOSIS — E119 Type 2 diabetes mellitus without complications: Secondary | ICD-10-CM | POA: Diagnosis not present

## 2021-05-29 DIAGNOSIS — I700 Atherosclerosis of aorta: Secondary | ICD-10-CM | POA: Diagnosis not present

## 2021-05-29 DIAGNOSIS — Z20822 Contact with and (suspected) exposure to covid-19: Secondary | ICD-10-CM | POA: Diagnosis not present

## 2021-05-29 DIAGNOSIS — G9341 Metabolic encephalopathy: Secondary | ICD-10-CM | POA: Diagnosis not present

## 2021-05-29 DIAGNOSIS — R4701 Aphasia: Secondary | ICD-10-CM | POA: Diagnosis not present

## 2021-05-29 DIAGNOSIS — Z7401 Bed confinement status: Secondary | ICD-10-CM | POA: Diagnosis not present

## 2021-05-29 DIAGNOSIS — I959 Hypotension, unspecified: Secondary | ICD-10-CM | POA: Diagnosis not present

## 2021-05-29 DIAGNOSIS — Z6832 Body mass index (BMI) 32.0-32.9, adult: Secondary | ICD-10-CM

## 2021-05-29 DIAGNOSIS — I6350 Cerebral infarction due to unspecified occlusion or stenosis of unspecified cerebral artery: Secondary | ICD-10-CM | POA: Diagnosis not present

## 2021-05-29 DIAGNOSIS — M2550 Pain in unspecified joint: Secondary | ICD-10-CM | POA: Diagnosis not present

## 2021-05-29 DIAGNOSIS — R7309 Other abnormal glucose: Secondary | ICD-10-CM | POA: Diagnosis not present

## 2021-05-29 DIAGNOSIS — R4781 Slurred speech: Secondary | ICD-10-CM | POA: Diagnosis not present

## 2021-05-29 DIAGNOSIS — Z79899 Other long term (current) drug therapy: Secondary | ICD-10-CM | POA: Diagnosis not present

## 2021-05-29 DIAGNOSIS — N179 Acute kidney failure, unspecified: Secondary | ICD-10-CM | POA: Diagnosis present

## 2021-05-29 DIAGNOSIS — E785 Hyperlipidemia, unspecified: Secondary | ICD-10-CM

## 2021-05-29 DIAGNOSIS — Z8249 Family history of ischemic heart disease and other diseases of the circulatory system: Secondary | ICD-10-CM

## 2021-05-29 DIAGNOSIS — G4700 Insomnia, unspecified: Secondary | ICD-10-CM | POA: Diagnosis not present

## 2021-05-29 DIAGNOSIS — I161 Hypertensive emergency: Secondary | ICD-10-CM | POA: Diagnosis present

## 2021-05-29 DIAGNOSIS — I672 Cerebral atherosclerosis: Secondary | ICD-10-CM

## 2021-05-29 DIAGNOSIS — R7303 Prediabetes: Secondary | ICD-10-CM | POA: Diagnosis not present

## 2021-05-29 DIAGNOSIS — R2689 Other abnormalities of gait and mobility: Secondary | ICD-10-CM | POA: Diagnosis not present

## 2021-05-29 DIAGNOSIS — Z87891 Personal history of nicotine dependence: Secondary | ICD-10-CM | POA: Diagnosis not present

## 2021-05-29 DIAGNOSIS — J449 Chronic obstructive pulmonary disease, unspecified: Secondary | ICD-10-CM | POA: Diagnosis not present

## 2021-05-29 DIAGNOSIS — Z66 Do not resuscitate: Secondary | ICD-10-CM | POA: Diagnosis not present

## 2021-05-29 DIAGNOSIS — M6281 Muscle weakness (generalized): Secondary | ICD-10-CM | POA: Diagnosis not present

## 2021-05-29 DIAGNOSIS — R531 Weakness: Secondary | ICD-10-CM | POA: Diagnosis not present

## 2021-05-29 DIAGNOSIS — R200 Anesthesia of skin: Secondary | ICD-10-CM | POA: Diagnosis not present

## 2021-05-29 DIAGNOSIS — Z8673 Personal history of transient ischemic attack (TIA), and cerebral infarction without residual deficits: Secondary | ICD-10-CM | POA: Diagnosis not present

## 2021-05-29 LAB — I-STAT CHEM 8, ED
BUN: 22 mg/dL (ref 8–23)
Calcium, Ion: 1.01 mmol/L — ABNORMAL LOW (ref 1.15–1.40)
Chloride: 109 mmol/L (ref 98–111)
Creatinine, Ser: 1.3 mg/dL — ABNORMAL HIGH (ref 0.44–1.00)
Glucose, Bld: 147 mg/dL — ABNORMAL HIGH (ref 70–99)
HCT: 39 % (ref 36.0–46.0)
Hemoglobin: 13.3 g/dL (ref 12.0–15.0)
Potassium: 4.5 mmol/L (ref 3.5–5.1)
Sodium: 139 mmol/L (ref 135–145)
TCO2: 23 mmol/L (ref 22–32)

## 2021-05-29 LAB — LIPID PANEL
Cholesterol: 214 mg/dL — ABNORMAL HIGH (ref 0–200)
HDL: 46 mg/dL (ref >40)
LDL Cholesterol: 152 mg/dL — ABNORMAL HIGH (ref 0–99)
Total CHOL/HDL Ratio: 4.7 RATIO
Triglycerides: 78 mg/dL (ref <150)
VLDL: 16 mg/dL (ref 0–40)

## 2021-05-29 LAB — ECHOCARDIOGRAM COMPLETE
Height: 63 in
S' Lateral: 2.8 cm
Weight: 2906.54 oz

## 2021-05-29 LAB — CBC
HCT: 40.1 % (ref 36.0–46.0)
Hemoglobin: 13.1 g/dL (ref 12.0–15.0)
MCH: 34.3 pg — ABNORMAL HIGH (ref 26.0–34.0)
MCHC: 32.7 g/dL (ref 30.0–36.0)
MCV: 105 fL — ABNORMAL HIGH (ref 80.0–100.0)
Platelets: 291 10*3/uL (ref 150–400)
RBC: 3.82 MIL/uL — ABNORMAL LOW (ref 3.87–5.11)
RDW: 12.3 % (ref 11.5–15.5)
WBC: 8.5 10*3/uL (ref 4.0–10.5)
nRBC: 0.2 % (ref 0.0–0.2)

## 2021-05-29 LAB — DIFFERENTIAL
Abs Immature Granulocytes: 0.03 10*3/uL (ref 0.00–0.07)
Basophils Absolute: 0 10*3/uL (ref 0.0–0.1)
Basophils Relative: 1 %
Eosinophils Absolute: 0 10*3/uL (ref 0.0–0.5)
Eosinophils Relative: 0 %
Immature Granulocytes: 0 %
Lymphocytes Relative: 18 %
Lymphs Abs: 1.6 10*3/uL (ref 0.7–4.0)
Monocytes Absolute: 0.4 10*3/uL (ref 0.1–1.0)
Monocytes Relative: 5 %
Neutro Abs: 6.4 10*3/uL (ref 1.7–7.7)
Neutrophils Relative %: 76 %

## 2021-05-29 LAB — HEMOGLOBIN A1C
Hgb A1c MFr Bld: 6.1 % — ABNORMAL HIGH (ref 4.8–5.6)
Mean Plasma Glucose: 128.37 mg/dL

## 2021-05-29 LAB — COMPREHENSIVE METABOLIC PANEL
ALT: 11 U/L (ref 0–44)
AST: 14 U/L — ABNORMAL LOW (ref 15–41)
Albumin: 3.4 g/dL — ABNORMAL LOW (ref 3.5–5.0)
Alkaline Phosphatase: 49 U/L (ref 38–126)
Anion gap: 9 (ref 5–15)
BUN: 21 mg/dL (ref 8–23)
CO2: 22 mmol/L (ref 22–32)
Calcium: 8.8 mg/dL — ABNORMAL LOW (ref 8.9–10.3)
Chloride: 107 mmol/L (ref 98–111)
Creatinine, Ser: 1.38 mg/dL — ABNORMAL HIGH (ref 0.44–1.00)
GFR, Estimated: 35 mL/min — ABNORMAL LOW (ref >60)
Glucose, Bld: 151 mg/dL — ABNORMAL HIGH (ref 70–99)
Potassium: 4.7 mmol/L (ref 3.5–5.1)
Sodium: 138 mmol/L (ref 135–145)
Total Bilirubin: 0.6 mg/dL (ref 0.3–1.2)
Total Protein: 6.6 g/dL (ref 6.5–8.1)

## 2021-05-29 LAB — RESP PANEL BY RT-PCR (FLU A&B, COVID) ARPGX2
Influenza A by PCR: NEGATIVE
Influenza B by PCR: NEGATIVE
SARS Coronavirus 2 by RT PCR: NEGATIVE

## 2021-05-29 LAB — PROTIME-INR
INR: 1 (ref 0.8–1.2)
Prothrombin Time: 13.2 seconds (ref 11.4–15.2)

## 2021-05-29 LAB — TROPONIN I (HIGH SENSITIVITY): Troponin I (High Sensitivity): 39 ng/L — ABNORMAL HIGH (ref <18)

## 2021-05-29 LAB — TSH: TSH: 0.948 u[IU]/mL (ref 0.350–4.500)

## 2021-05-29 LAB — CBG MONITORING, ED
Glucose-Capillary: 134 mg/dL — ABNORMAL HIGH (ref 70–99)
Glucose-Capillary: 143 mg/dL — ABNORMAL HIGH (ref 70–99)

## 2021-05-29 LAB — APTT: aPTT: 27 seconds (ref 24–36)

## 2021-05-29 LAB — BRAIN NATRIURETIC PEPTIDE: B Natriuretic Peptide: 102 pg/mL — ABNORMAL HIGH (ref 0.0–100.0)

## 2021-05-29 IMAGING — MR MR HEAD W/O CM
12 of 13 series · 39 of 48 positions shown · non-contrast
Comparison: Head CT [6F] hours today, and earlier.

CLINICAL DATA: [AGE] female code stroke. Right side numbness
and facial droop.

EXAM:
MRI HEAD WITHOUT CONTRAST
TECHNIQUE: Multiplanar, multiecho pulse sequences of the brain and surrounding
structures were obtained without intravenous contrast.

[Series 5: DWI · axial · 3.0mm · 0.92mm/px · z∈[-76,+83]mm · 5 of 106 slices shown (1 of 4)]
[im 1/106]
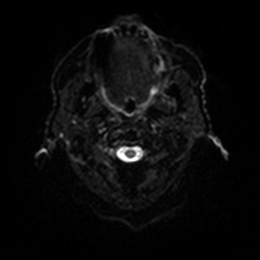
[im 27/106]
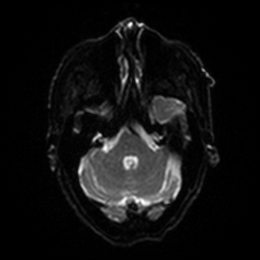
[im 53/106]
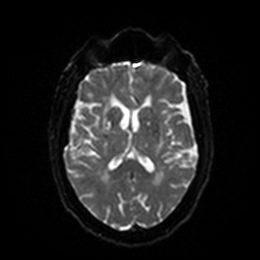
[im 79/106]
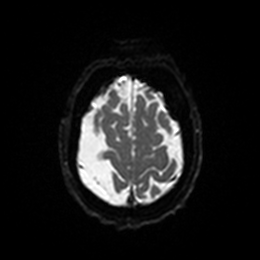
[im 106/106]
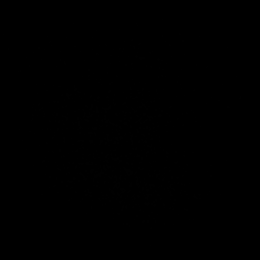

[Series 6: DWI · axial · 3.0mm · 0.92mm/px · z∈[-76,+77]mm · 2 of 52 slices shown (2 of 4)]
[im 1/52]
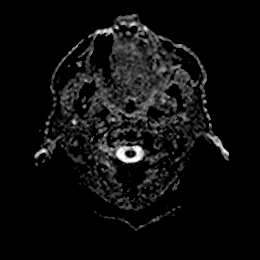
[im 52/52]
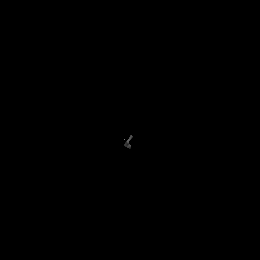

[Series 7: DWI · coronal · 4.0mm · 0.88mm/px · 4 of 80 slices shown (3 of 4)]
[im 1/80]
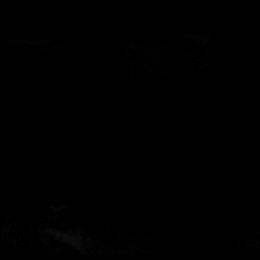
[im 27/80]
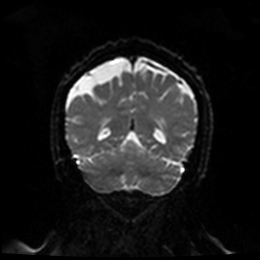
[im 53/80]
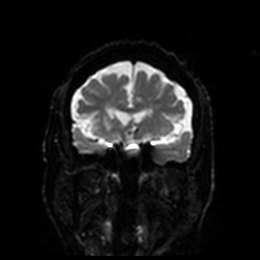
[im 80/80]
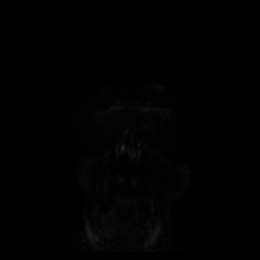

[Series 8: DWI · coronal · 4.0mm · 0.88mm/px · 2 of 40 slices shown (4 of 4)]
[im 1/40]
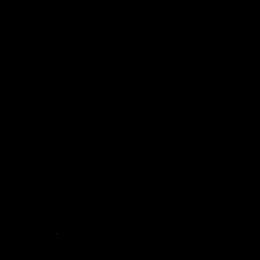
[im 40/40]
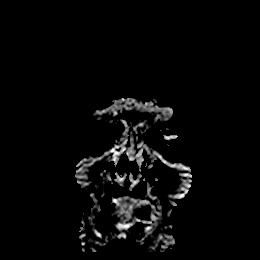

[Series 9: T1 · sagittal · 5.0mm · 0.78mm/px · 2 of 27 slices shown]
[im 1/27]
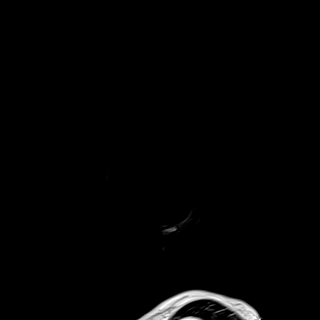
[im 27/27]
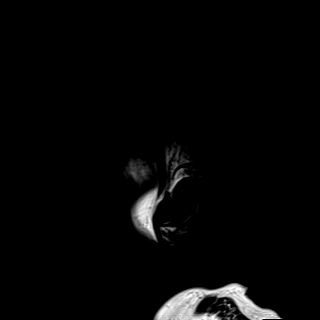

[Series 10: T2 · axial · 5.0mm · 0.75mm/px · z∈[-75,+80]mm · 2 of 27 slices shown (1 of 2)]
[im 1/27]
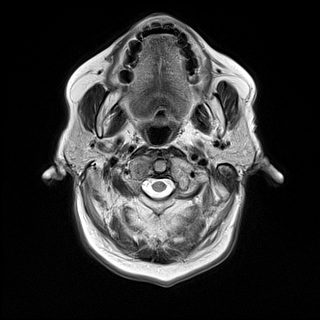
[im 27/27]
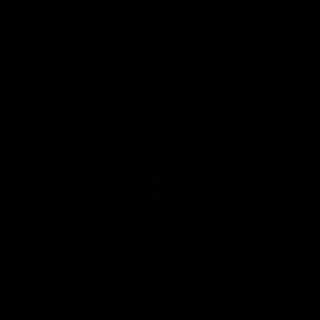

[Series 11: FLAIR · axial · 5.0mm · 0.47mm/px · z∈[-75,+80]mm · 2 of 27 slices shown]
[im 1/27]
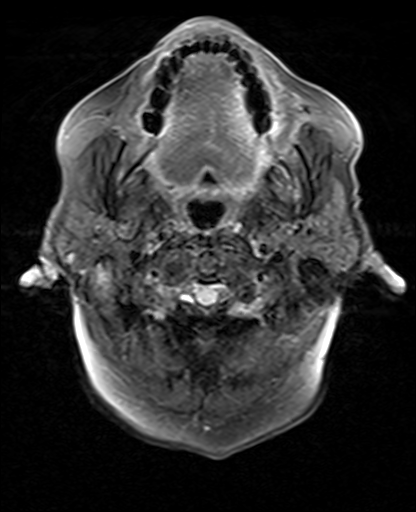
[im 27/27]
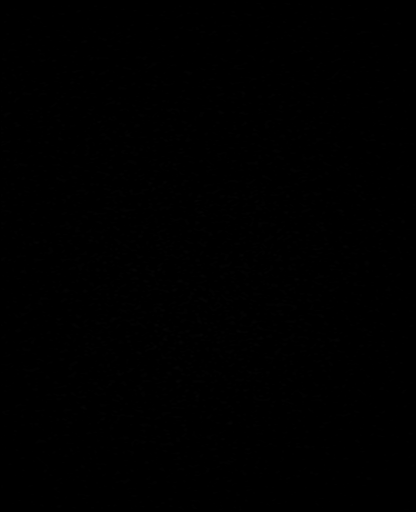

[Series 12: mag_images · axial · 3.0mm · 0.90mm/px · z∈[-74,+78]mm · 3 of 52 slices shown]
[im 1/52]
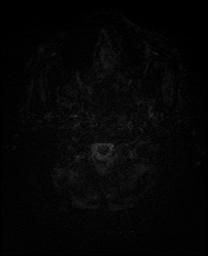
[im 26/52]
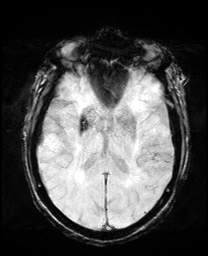
[im 52/52]
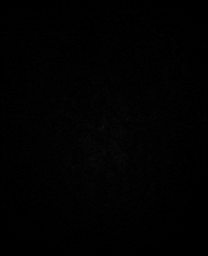

[Series 13: pha_images · axial · 3.0mm · 0.90mm/px · z∈[-74,+72]mm · 3 of 49 slices shown]
[im 1/49]
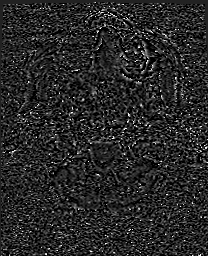
[im 25/49]
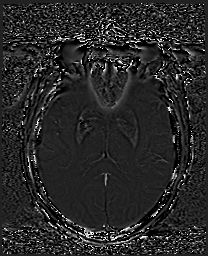
[im 49/49]
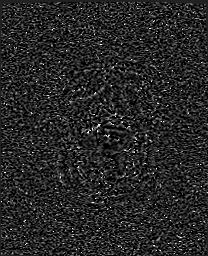

[Series 14: swi_images · axial · 3.0mm · 0.90mm/px · z∈[-74,+78]mm · 3 of 49 slices shown]
[im 1/49]
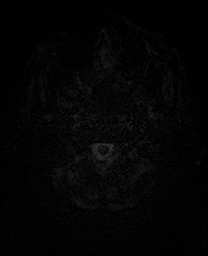
[im 25/49]
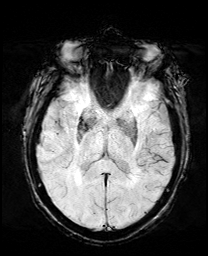
[im 49/49]
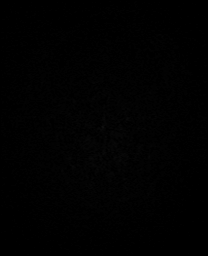

[Series 15: mip_images(sw) · axial · 24.0mm · 0.90mm/px · z∈[-64,+68]mm · 3 of 45 slices shown]
[im 1/45]
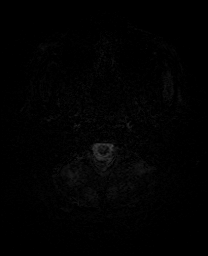
[im 23/45]
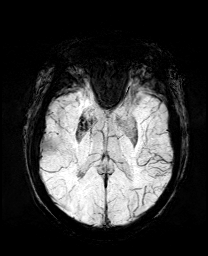
[im 45/45]
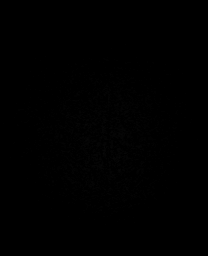

[Series 1001: T2 · coronal · 0.8mm · 0.47mm/px · 8 of 247 slices shown (2 of 2)]
[im 1/247]
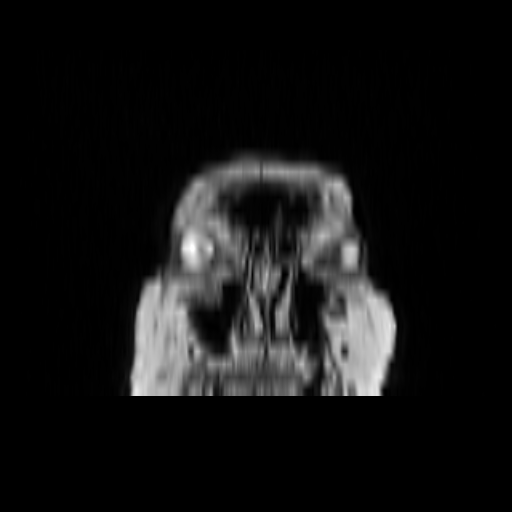
[im 38/247]
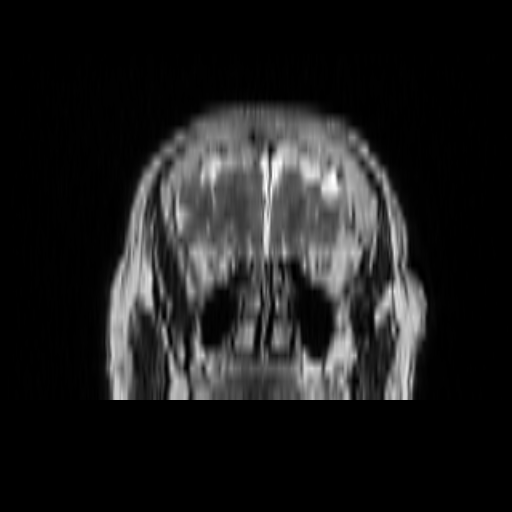
[im 76/247]
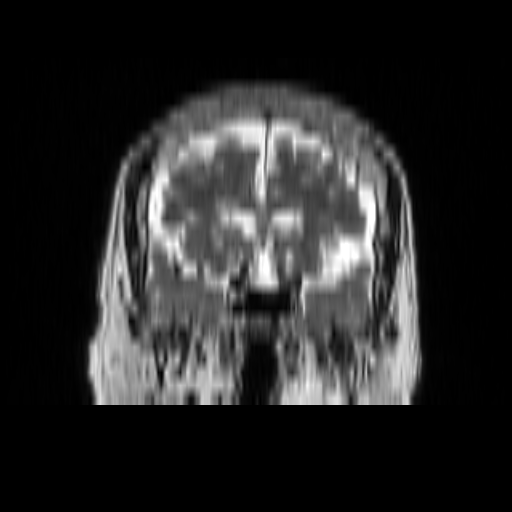
[im 114/247]
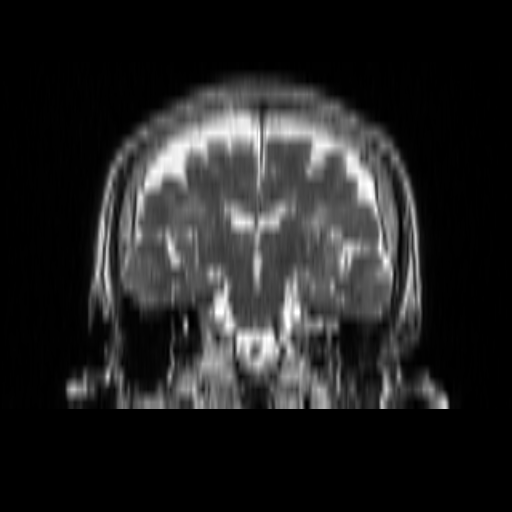
[im 133/247]
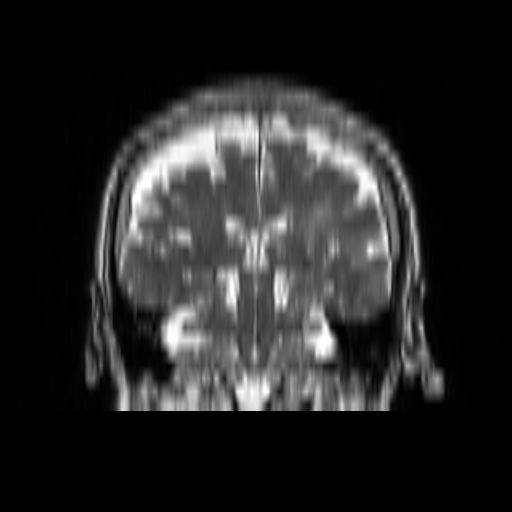
[im 171/247]
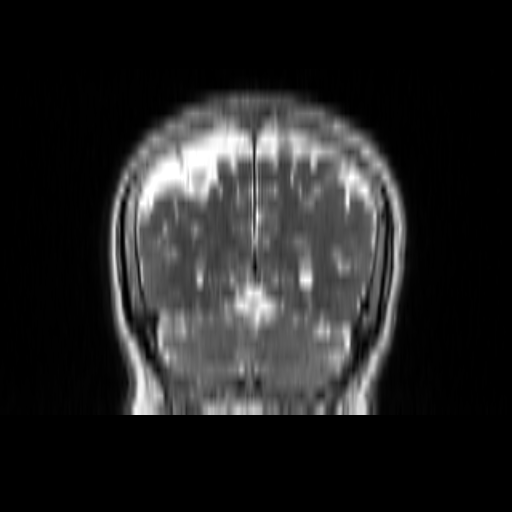
[im 209/247]
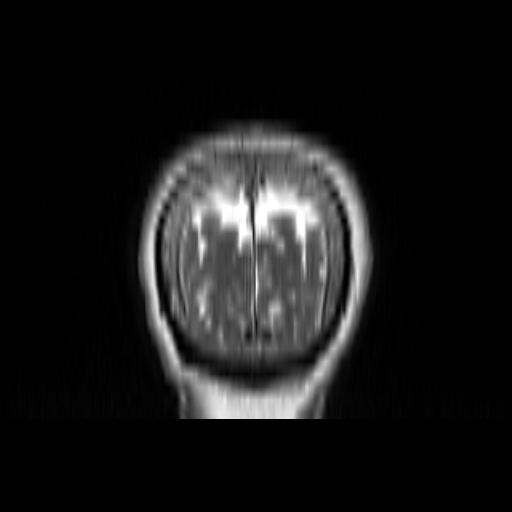
[im 247/247]
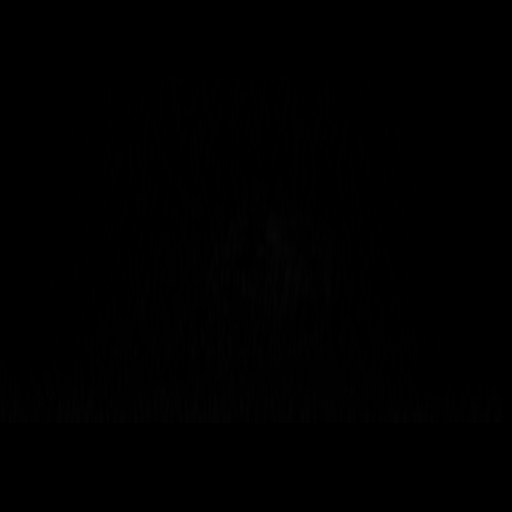

[39 of 48 positions shown; findings below may reference images not displayed]

FINDINGS: Brain: Small discontinuous areas of cortical restricted diffusion in
the posterior left MCA territory affecting the lateral and superior
left parietal lobe. Patchy regional white matter involvement also.
Minimal associated cytotoxic edema. No hemorrhage or mass effect.

No other restricted diffusion identified. Small chronic infarcts in
the bilateral cerebellum, bilateral deep gray matter nuclei and
corona radiata. Patchy and confluent bilateral white matter T2 and
FLAIR hyperintensity. Patchy T2 heterogeneity in the pons.

No chronic cortical encephalomalacia or definite chronic cerebral
blood products. No midline shift, mass effect, evidence of mass
lesion, ventriculomegaly, extra-axial collection or acute
intracranial hemorrhage. Cervicomedullary junction and pituitary are
within normal limits.

Vascular: Major intracranial vascular flow voids are preserved. See
also MRA reported separately.

Skull and upper cervical spine: Negative for age visible cervical
spine. Hyperostosis of the calvarium (normal variant). Normal bone
marrow signal.

Sinuses/Orbits: Negative.

Other: Visible internal auditory structures appear normal. Mastoids
are well aerated. Negative visible scalp and face.
IMPRESSION: 1. Small, patchy acute infarcts in the posterior Left MCA territory.
No associated hemorrhage or mass effect.
2. Underlying advanced chronic small vessel disease.

## 2021-05-29 IMAGING — CT CT HEAD CODE STROKE
4 series · 15 of 47 positions shown, 17 images · non-contrast
Comparison: [DATE]

CLINICAL DATA: Code stroke.  Right-sided numbness and facial droop

EXAM:
CT HEAD WITHOUT CONTRAST
TECHNIQUE: Contiguous axial images were obtained from the base of the skull
through the vertex without intravenous contrast.

[Series 2: head wo · axial · 0.40mm/px · z∈[-534,-424]mm · 7 of 30 slices shown, 9 images]
[im 4/30  brain]
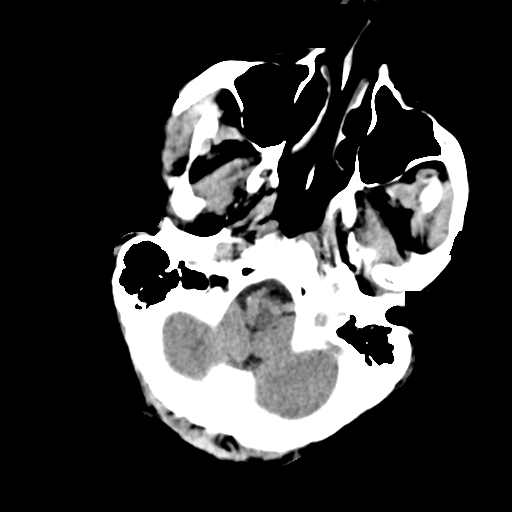
[im 4/30  bone]
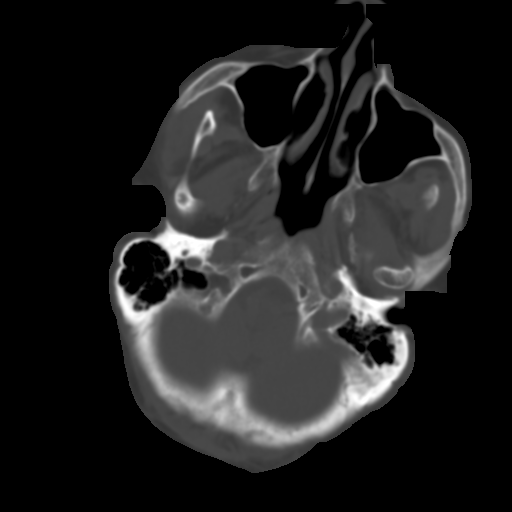
[im 8/30  brain]
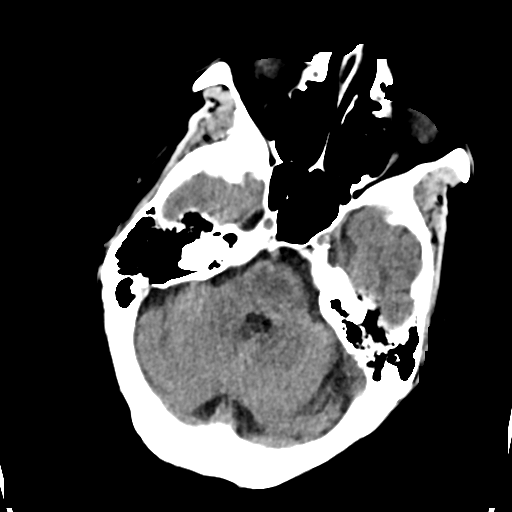
[im 11/30  brain]
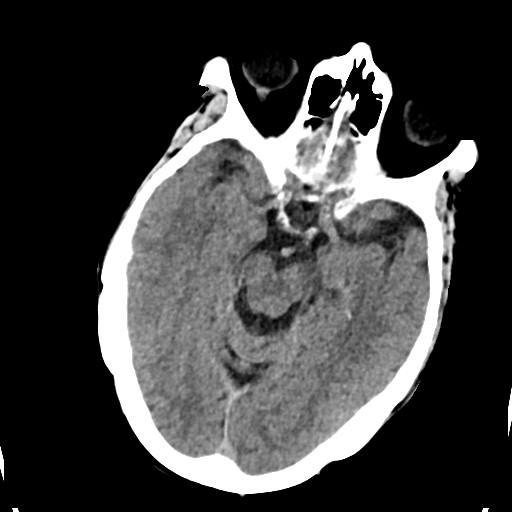
[im 15/30  brain]
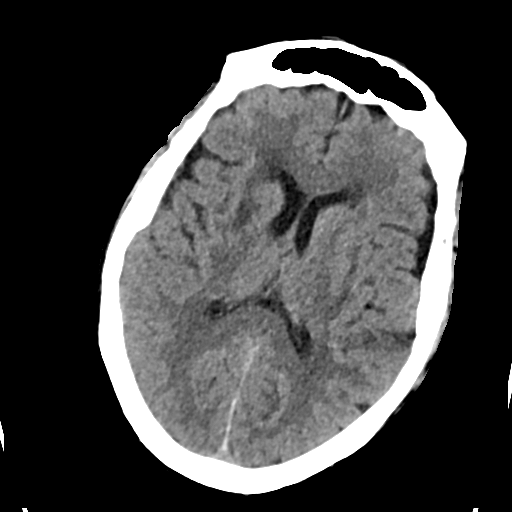
[im 19/30  brain]
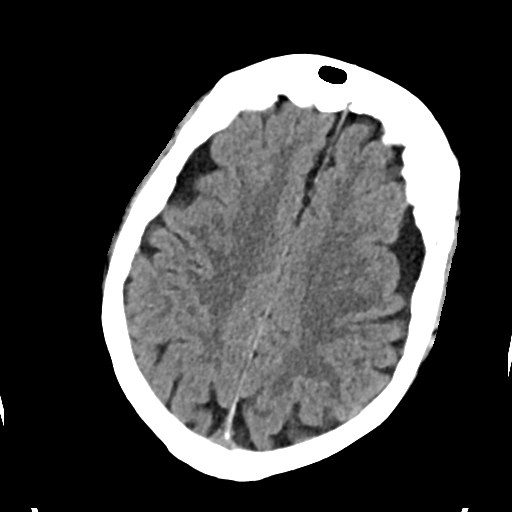
[im 19/30  bone]
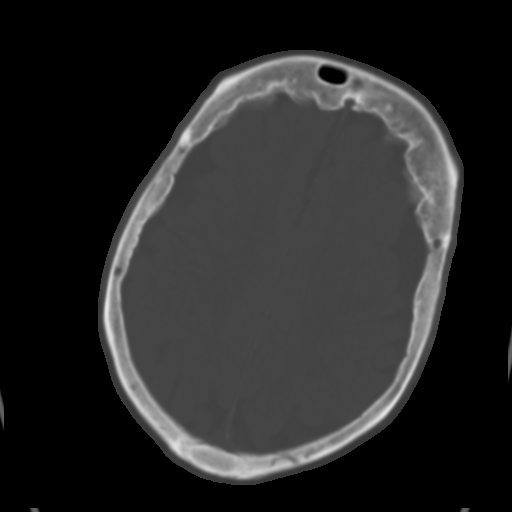
[im 22/30  brain]
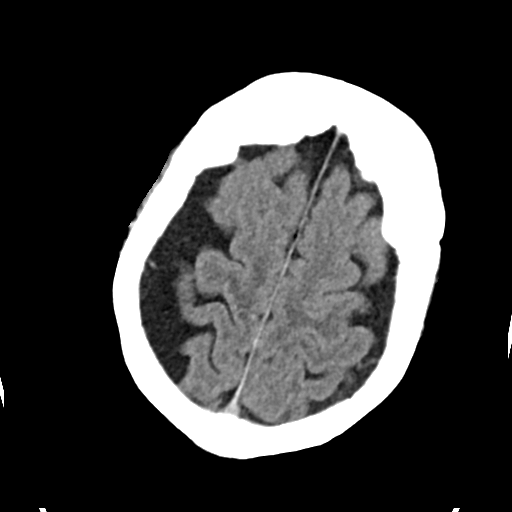
[im 26/30  brain]
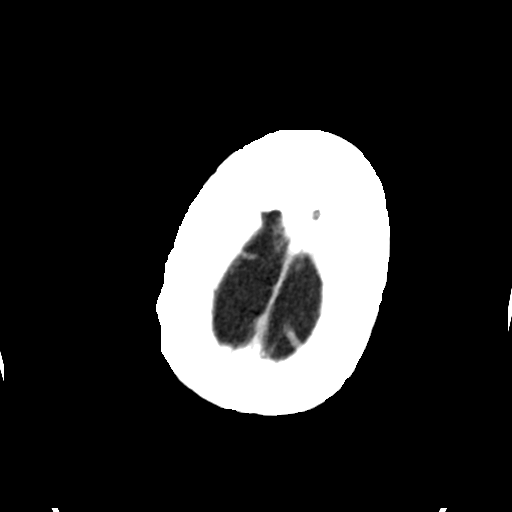

[Series 4: head bone · axial · 0.40mm/px · z∈[-535,-521]mm · 2 of 73 slices shown]
[im 8/73  bone]
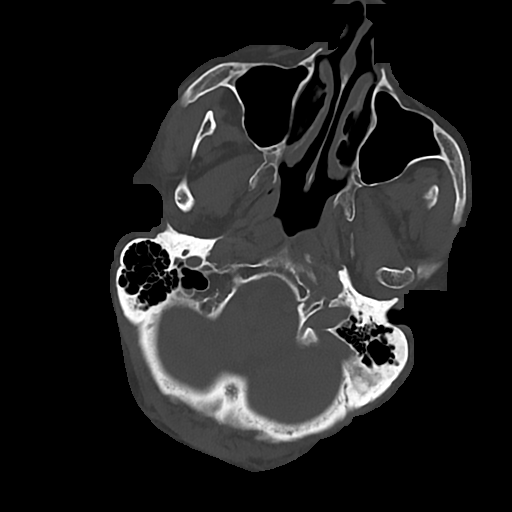
[im 15/73  bone]
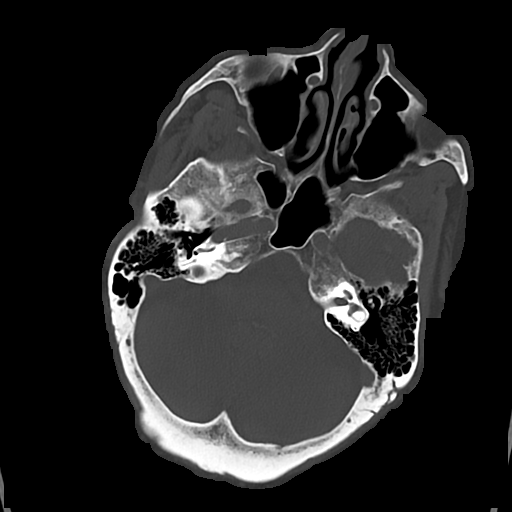

[Series 5: cor soft · coronal · 0.29mm/px · 3 of 68 slices shown]
[im 23/68  brain]
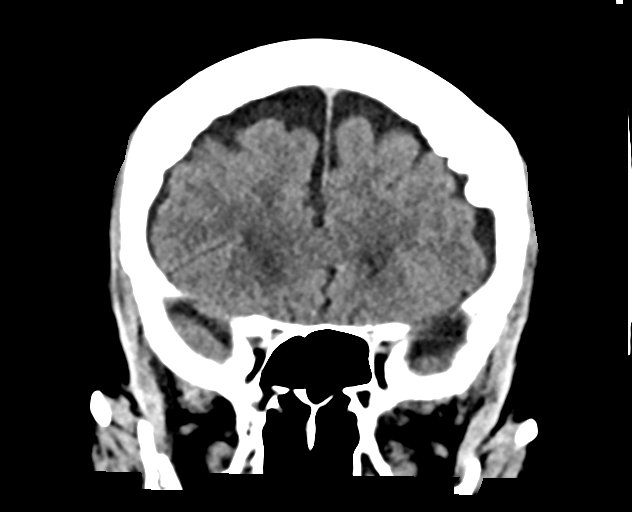
[im 30/68  brain]
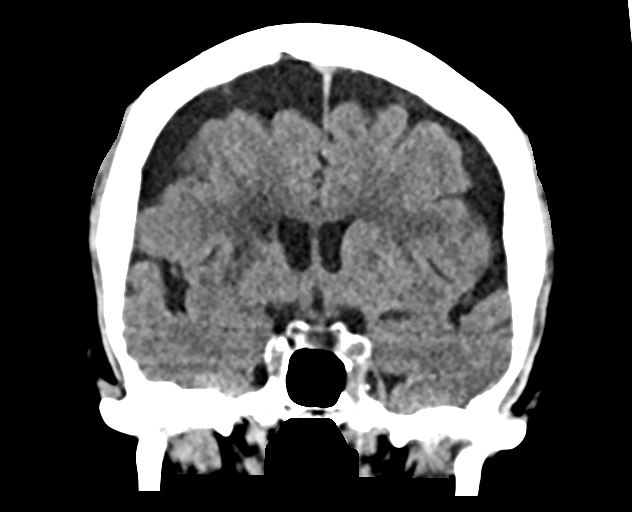
[im 38/68  brain]
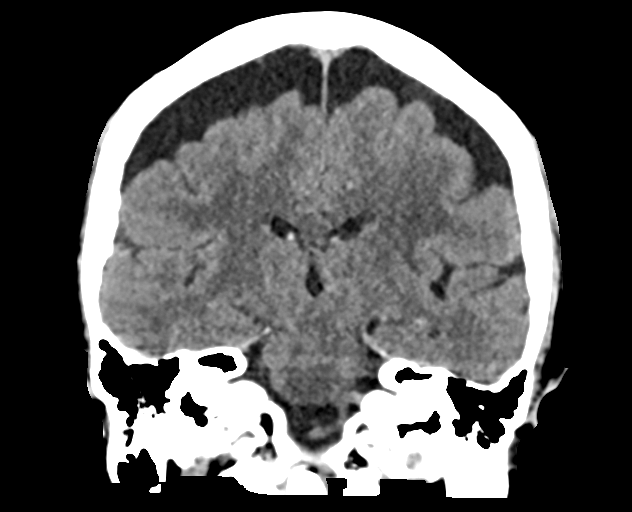

[Series 6: sag soft · sagittal · 0.29mm/px · 3 of 61 slices shown]
[im 21/61  brain]
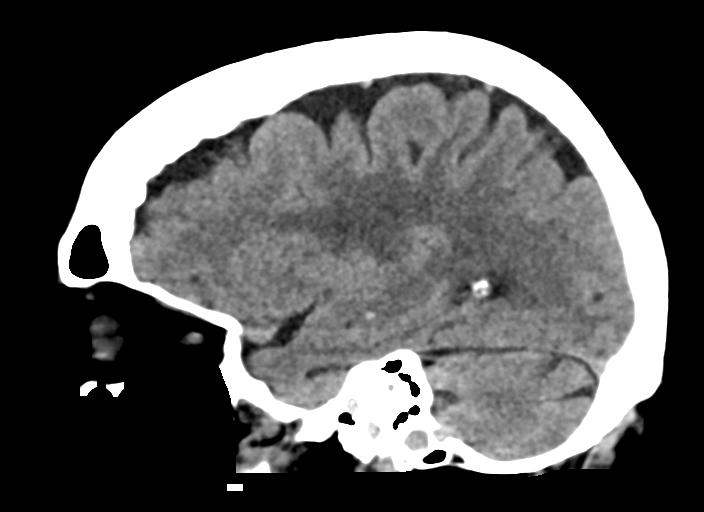
[im 31/61  brain]
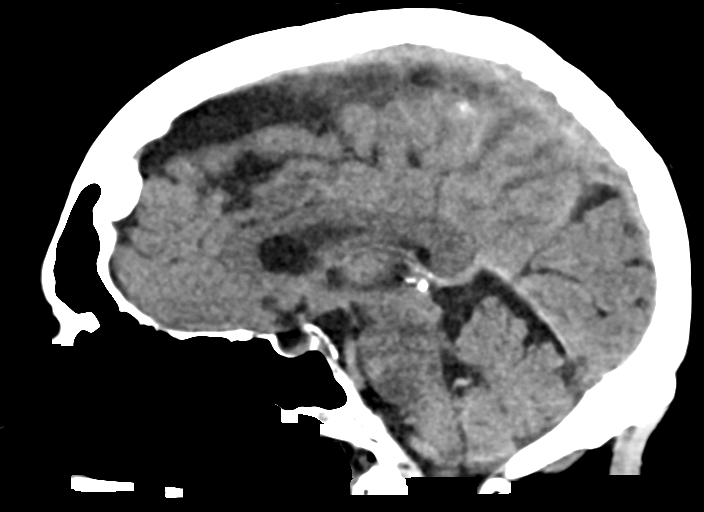
[im 41/61  brain]
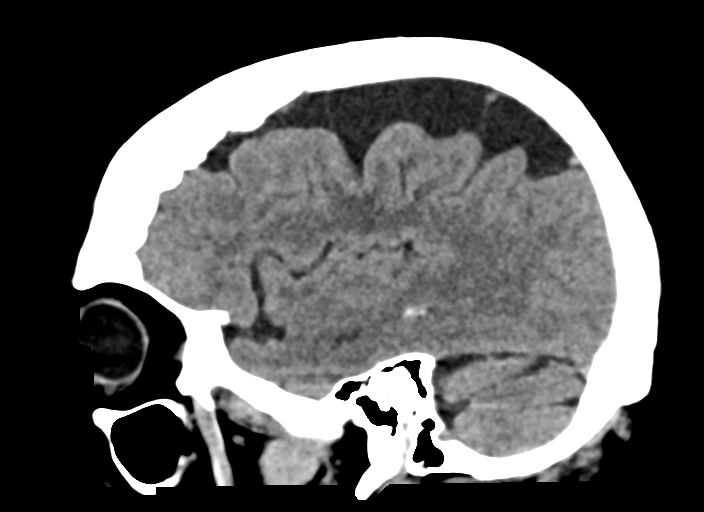

[15 of 47 positions shown; findings below may reference images not displayed]

FINDINGS: Brain: No acute cortical infarction. No acute hemorrhage,
hydrocephalus, extra-axial collection, mass, mass effect, or midline
shift degree of cerebral atrophy is not unexpected for age.
Periventricular white matter changes, likely the sequela of chronic
small vessel ischemic disease. Right basal ganglia lacunar infarct.

Vascular: No hyperdense vessel or unexpected calcification.

Skull: Normal. Negative for fracture or focal lesion.

Sinuses/Orbits: No acute finding. Status post bilateral lens
replacements.

Other: The mastoids are well aerated.

ASPECTS (Alberta Stroke Program Early CT Score)

- Ganglionic level infarction (caudate, lentiform nuclei, internal
capsule, insula, M1-M3 cortex): 7

- Supraganglionic infarction (M4-M6 cortex): 3

Total score (0-10 with 10 being normal): 10
IMPRESSION: 1. No acute intracranial process.
2. ASPECTS is 10

Code stroke imaging results were communicated on [DATE] at [DATE] to provider [place] via secure text paging.

## 2021-05-29 IMAGING — MR MR MRA NECK W/ CM
4 of 5 series · 41 of 48 positions shown · IV contrast (Gadavist)
Comparison: Brain MRI today.

CLINICAL DATA: [AGE] female code stroke. Right side numbness
and facial droop.

EXAM:
MRA NECK WITHOUT AND WITH CONTRAST
TECHNIQUE: Angiographic images of the neck were acquired using MRA technique
without and with intravenous contrast. Carotid stenosis measurements
(when applicable) are obtained utilizing NASCET criteria, using the
distal internal carotid diameter as the denominator.
CONTRAST:  8mL GADAVIST GADOBUTROL 1 MMOL/ML IV SOLN

[Series 14: tof_fl3d_tra_iso · axial · 0.6mm · 0.78mm/px · z∈[-161,-75]mm · 25 of 146 slices shown]
[im 1/146]
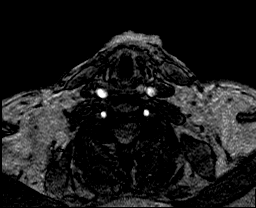
[im 7/146]
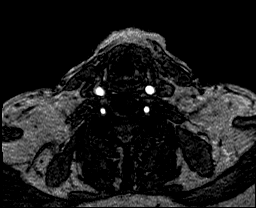
[im 13/146]
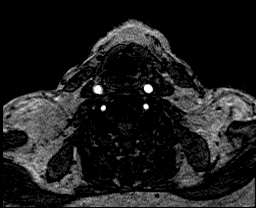
[im 19/146]
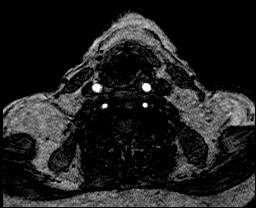
[im 25/146]
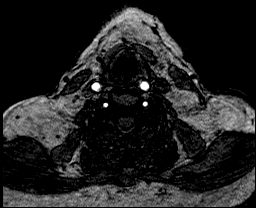
[im 31/146]
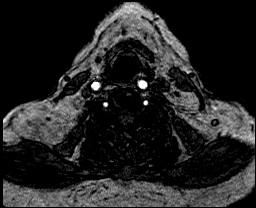
[im 37/146]
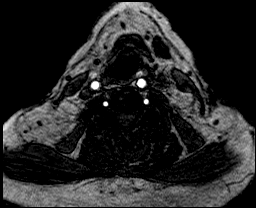
[im 43/146]
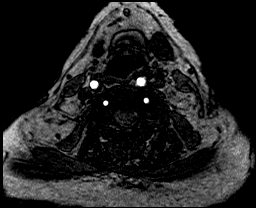
[im 49/146]
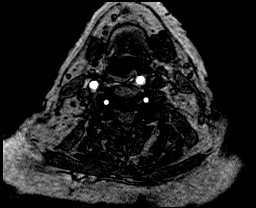
[im 55/146]
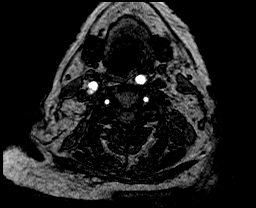
[im 61/146]
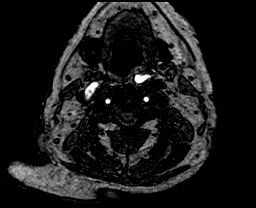
[im 67/146]
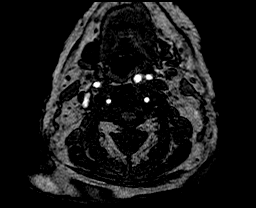
[im 73/146]
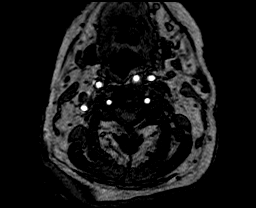
[im 79/146]
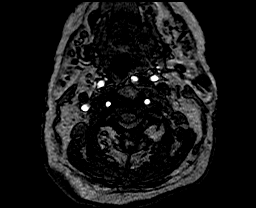
[im 85/146]
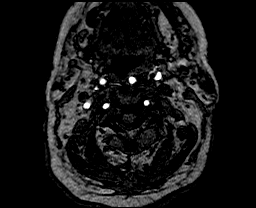
[im 91/146]
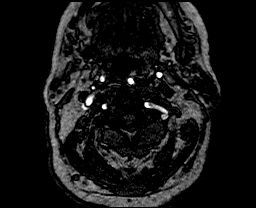
[im 97/146]
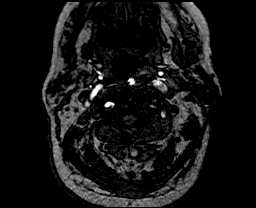
[im 103/146]
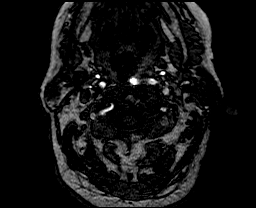
[im 109/146]
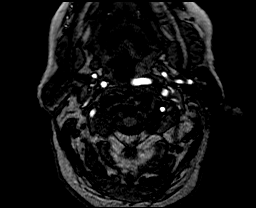
[im 115/146]
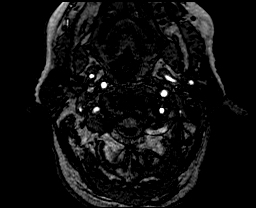
[im 121/146]
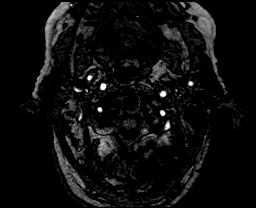
[im 127/146]
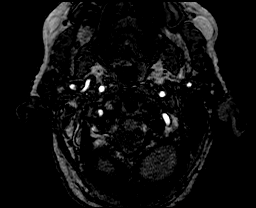
[im 133/146]
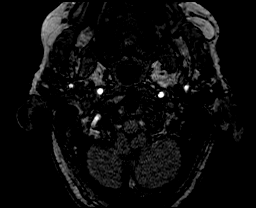
[im 139/146]
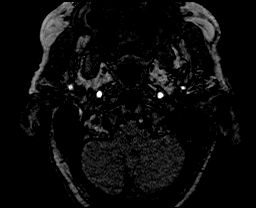
[im 146/146]
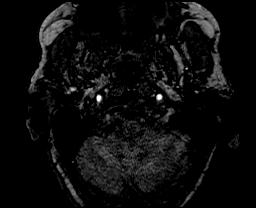

[Series 18: angio_fl3d_cor_post_ttc=3.0s · coronal · 0.9mm · 0.88mm/px · 14 of 80 slices shown]
[im 1/80]
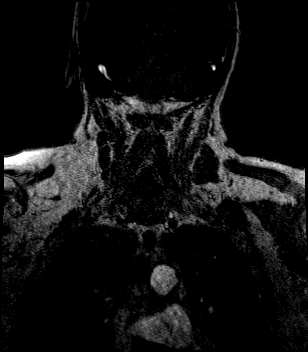
[im 7/80]
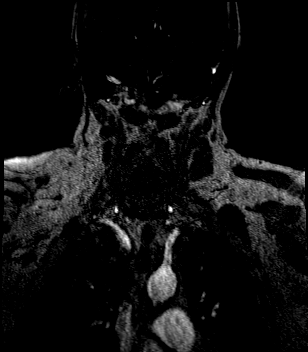
[im 13/80]
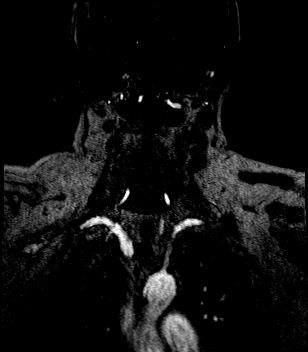
[im 19/80]
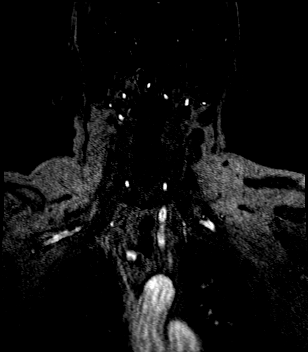
[im 25/80]
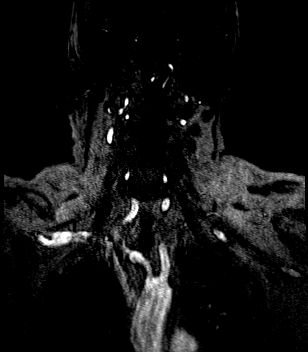
[im 31/80]
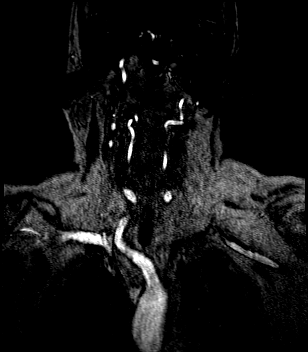
[im 37/80]
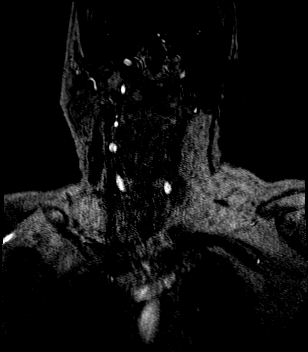
[im 43/80]
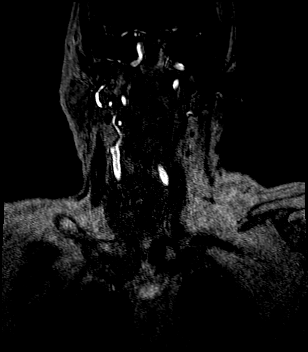
[im 49/80]
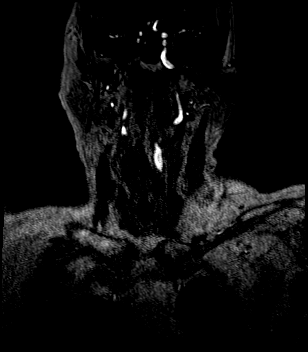
[im 55/80]
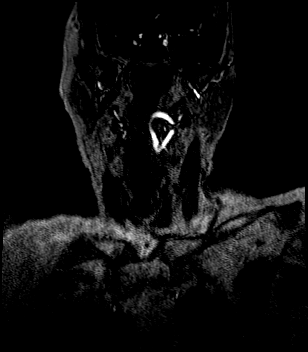
[im 61/80]
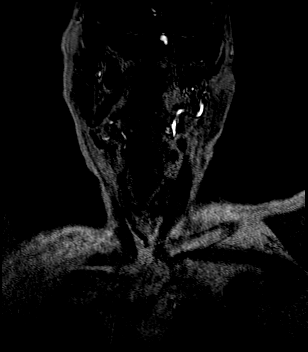
[im 67/80]
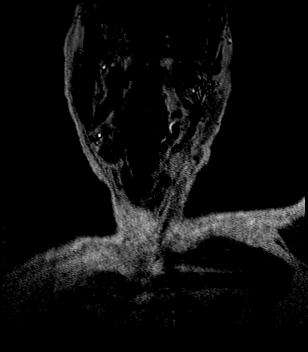
[im 73/80]
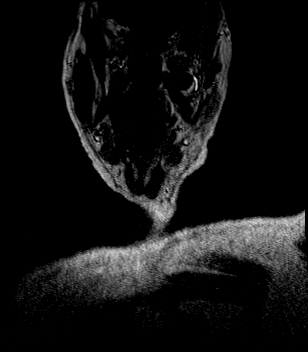
[im 80/80]
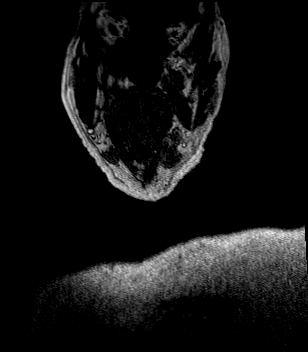

[Series 1009: lt bifur rotate · sagittal · 0.6mm · 0.20mm/px · 1 of 4 slices shown]
[im 1/4]
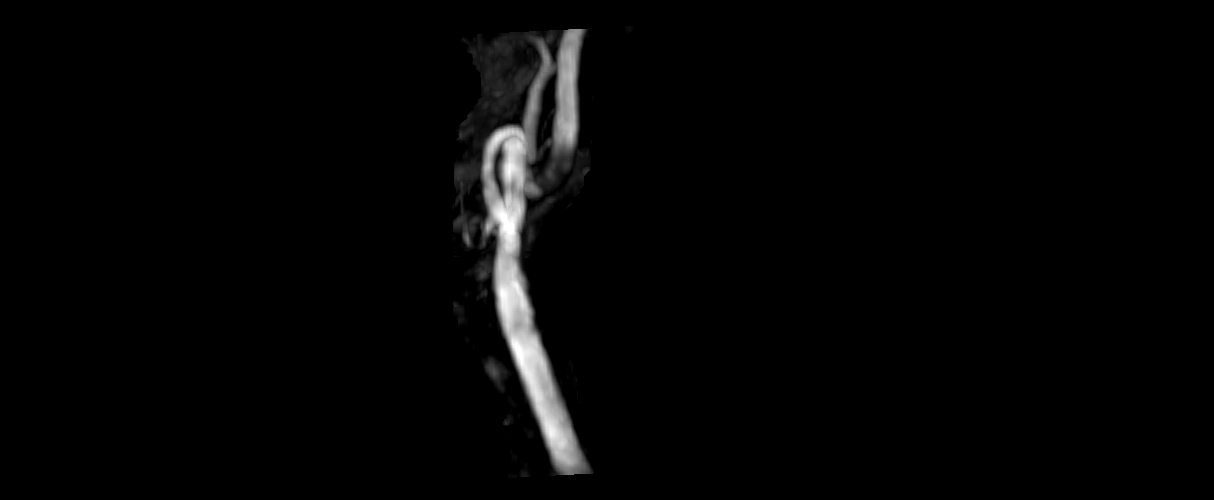

[Series 1033: vert tumble · axial · 0.9mm · 0.31mm/px · 1 of 2 slices shown]
[im 1/2]
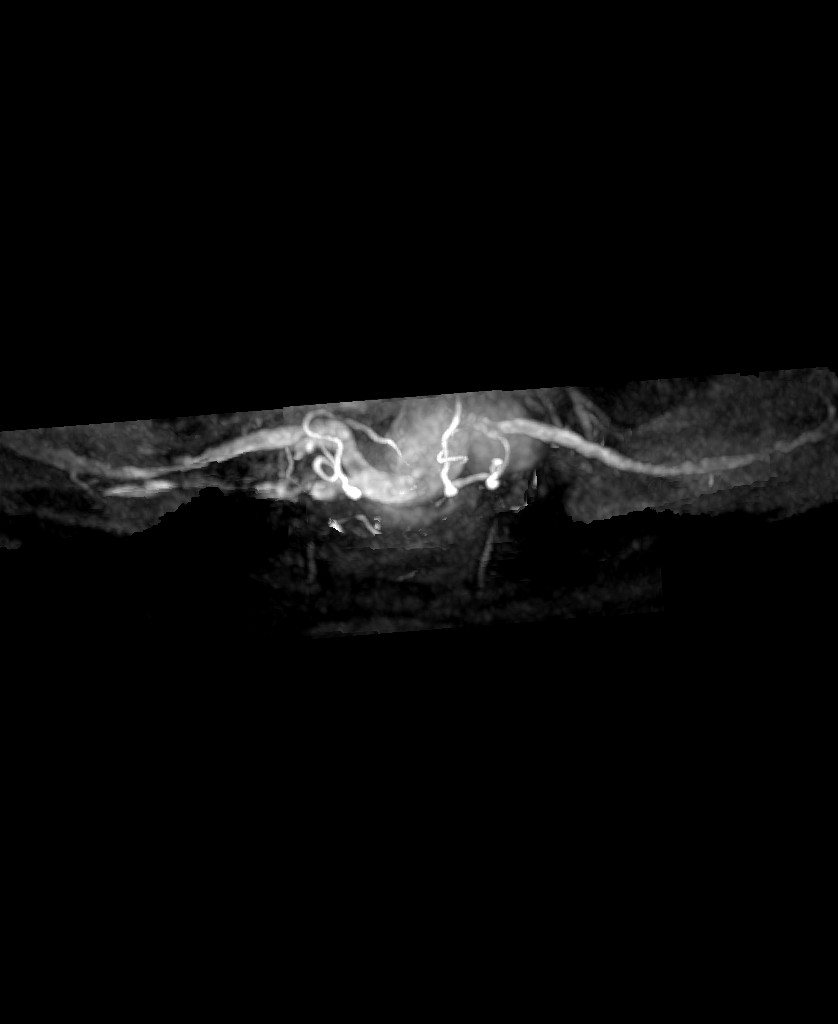

[41 of 48 positions shown; findings below may reference images not displayed]

FINDINGS: Precontrast time-of-flight images demonstrate antegrade flow in the
bilateral cervical carotid and vertebral arteries. Mild vessel
tortuosity. Retropharyngeal course of the left carotid and
bifurcation. Time-of-flight images suggest no hemodynamically
significant stenosis.

Postcontrast images demonstrate a 3 vessel arch configuration.
Proximal great vessels are within normal limits.

Mildly tortuous right CCA without stenosis. Patent right carotid
bifurcation with only minor irregularity at the right ICA origin and
bulb. Tortuous cervical right ICA without stenosis.

Tortuous left CCA without stenosis. Patent left carotid bifurcation.
Irregularity is mostly at the ECA origin. No proximal left ICA
stenosis. Tortuous left ICA distal to the bulb with a kinked
appearance (series [RZ], image 5), but no other cervical left ICA
stenosis.

Proximal subclavian arteries and vertebral artery origins are
patent. Mild bilateral vertebral origin stenosis suspected. Both
vertebral arteries are patent to the skull base without additional
stenosis.

Visible bilateral ICA siphons appear patent with irregularity
greater on the left and maximal at the left supraclinoid segment
suggesting mild-to-moderate stenosis there (series [RZ], image 1).
Patent carotid termini. Patent basilar artery. Moderate stenosis of
the left PCA P1/P2 also suspected on series [RZ], image 12. See
intracranial MRA reported separately.
IMPRESSION: 1. Arterial tortuosity in the neck with no hemodynamically
significant stenosis other than a kinked appearance of the tortuous
left ICA distal to the bulb.
2. Evidence of intracranial atherosclerosis, see Head MRA reported
separately.

## 2021-05-29 IMAGING — MR MR MRA HEAD W/O CM
1 series · 18 of 48 positions shown · non-contrast
Comparison: Brain MRI and neck MRA today.

CLINICAL DATA: [AGE] female code stroke. Right side numbness
and facial droop. Patchy acute infarcts in the posterior left MCA
territory.

EXAM:
MRA HEAD WITHOUT CONTRAST
TECHNIQUE: Angiographic images of the Circle of Willis were acquired using MRA
technique without intravenous contrast.

[Series 5: 3d cow · axial · 0.5mm · 0.41mm/px · z∈[-64,+17]mm · 18 of 172 slices shown]
[im 1/172]
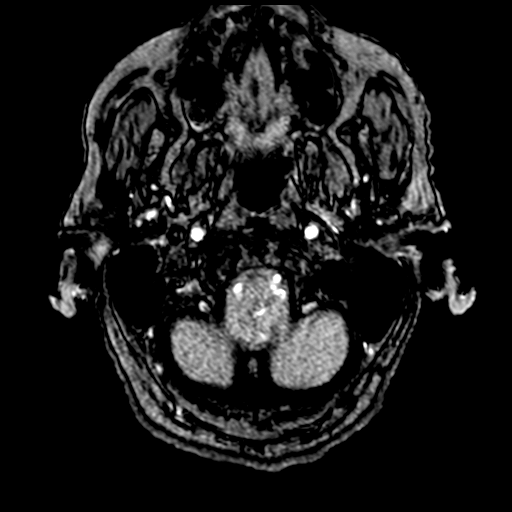
[im 4/172]
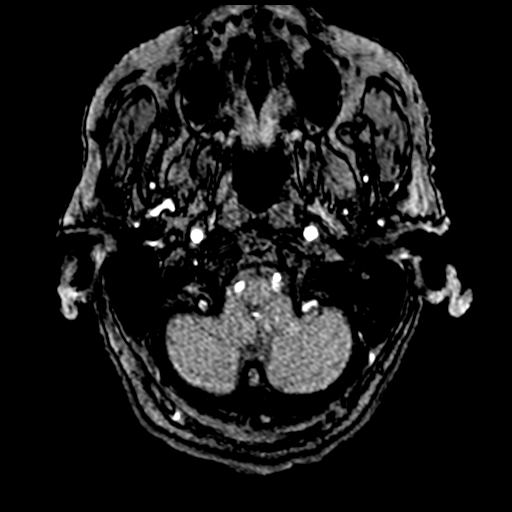
[im 8/172]
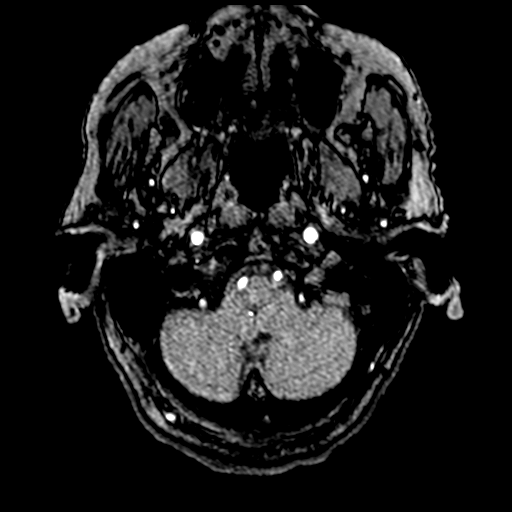
[im 11/172]
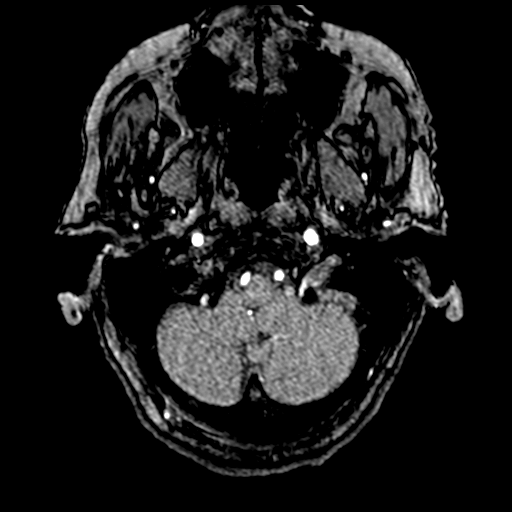
[im 15/172]
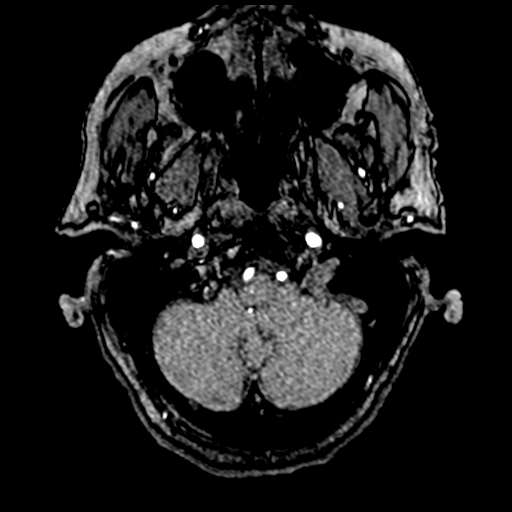
[im 19/172]
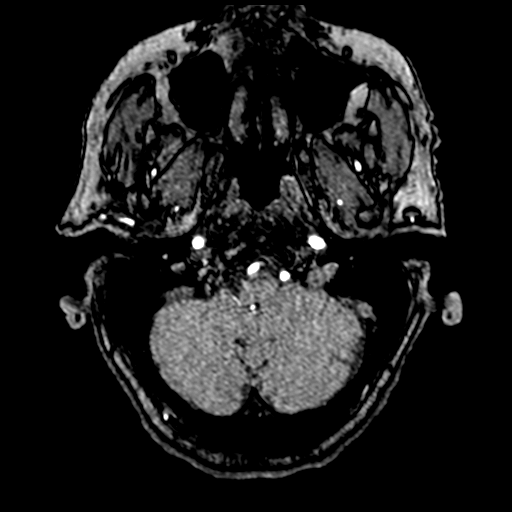
[im 22/172]
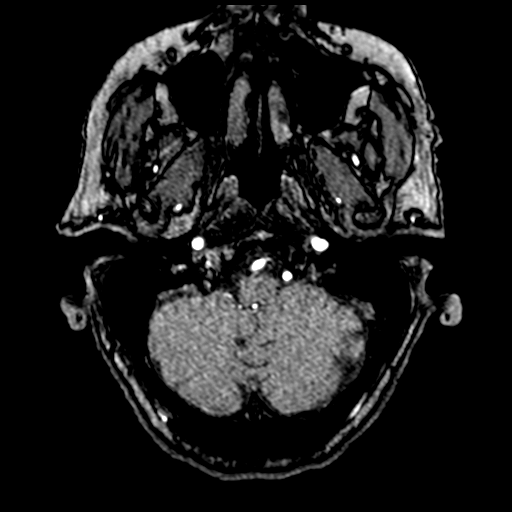
[im 26/172]
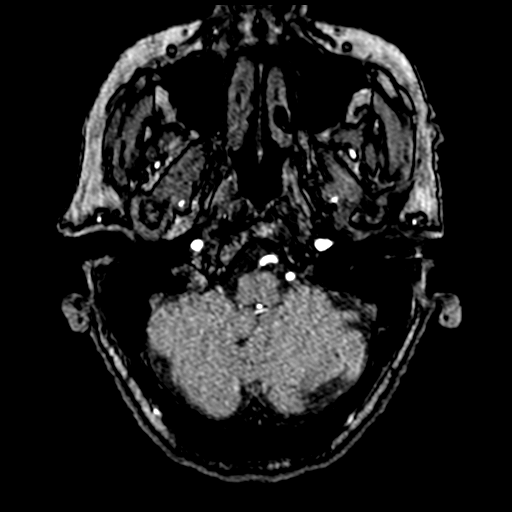
[im 30/172]
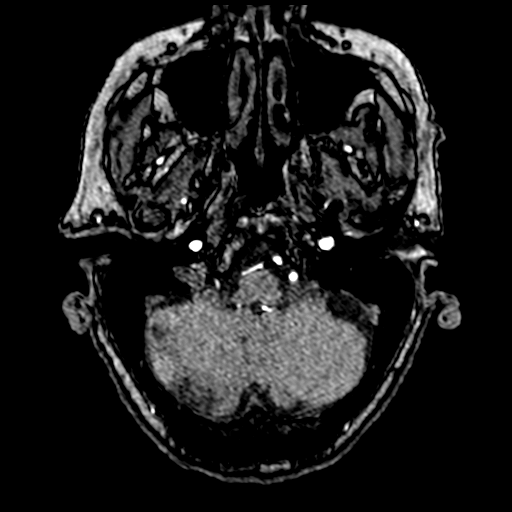
[im 33/172]
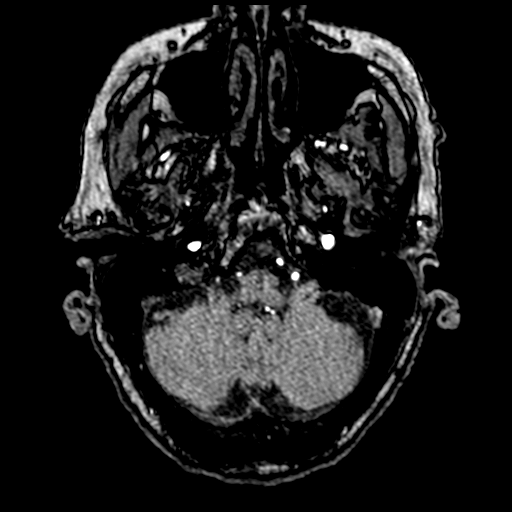
[im 55/172]
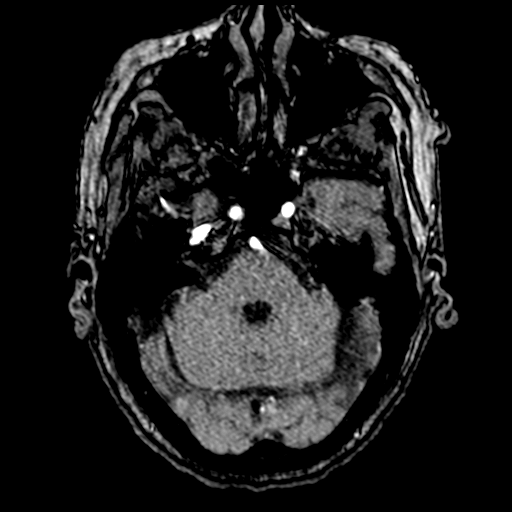
[im 77/172]
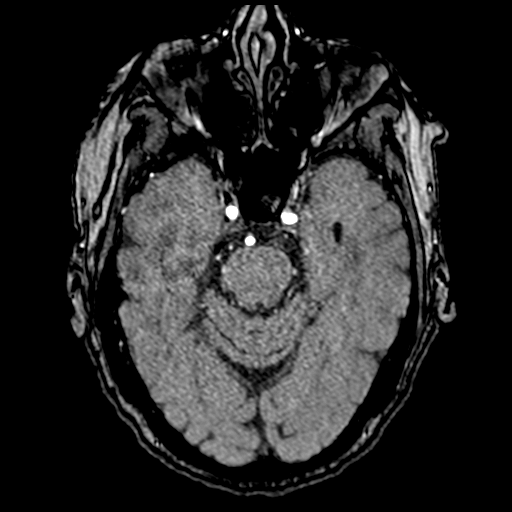
[im 88/172]
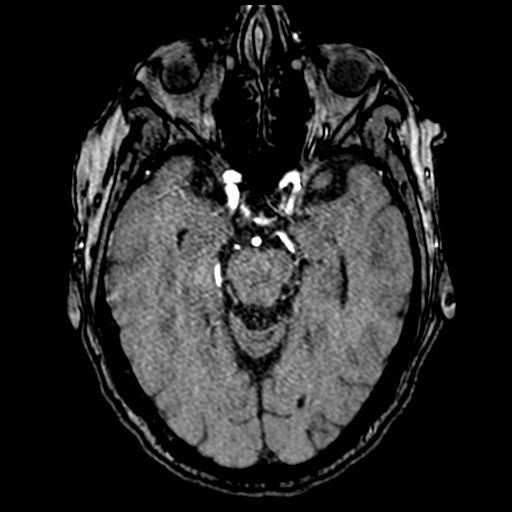
[im 99/172]
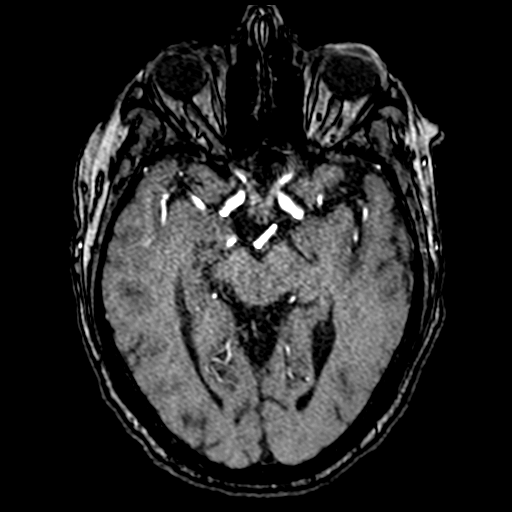
[im 121/172]
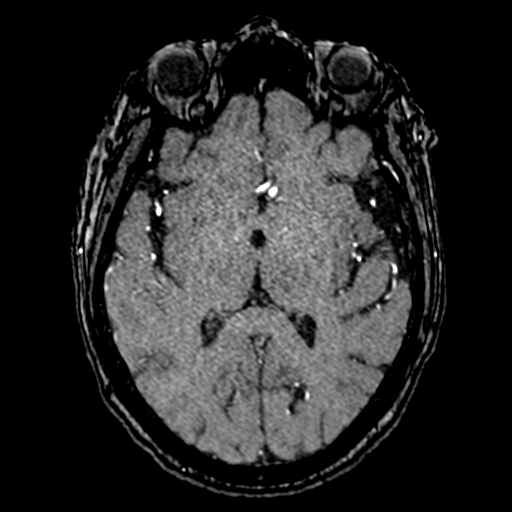
[im 142/172]
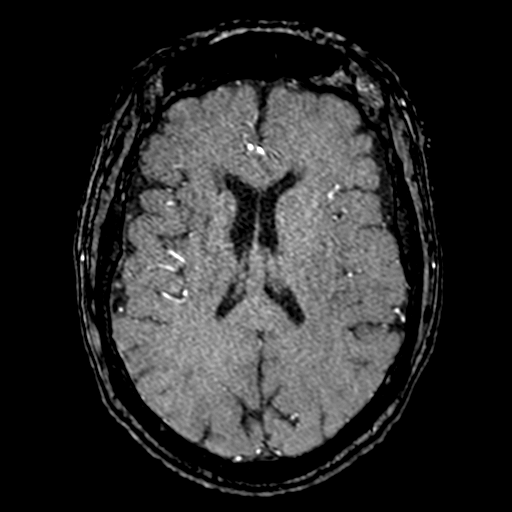
[im 146/172]
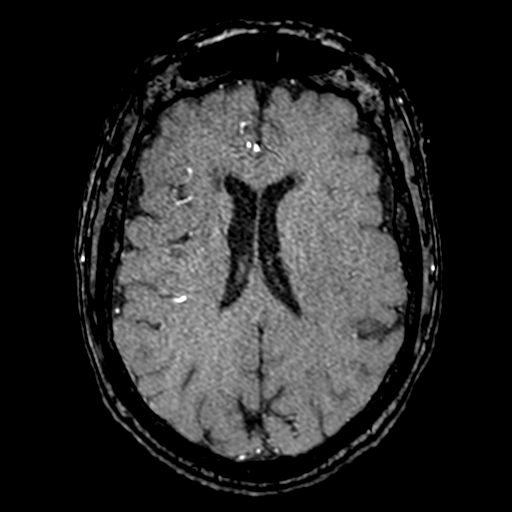
[im 164/172]
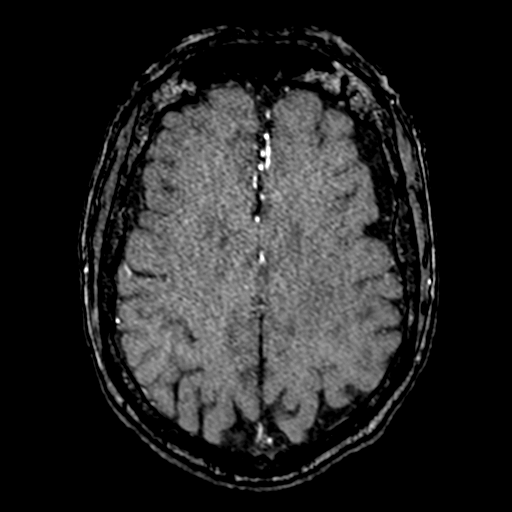

[18 of 48 positions shown; findings below may reference images not displayed]

FINDINGS: Antegrade flow in the posterior circulation. Codominant distal
vertebral arteries and vertebrobasilar junction appear patent
without stenosis. Normal right PICA origin. Patent basilar artery
without stenosis. Patent SCA and PCA origins. Right PCA branches are
patent with only mild irregularity. There is moderate irregularity
and stenosis at the junction of the left P1 and P2 segments on
series [4L], image 12). Otherwise mild left PCA branch irregularity.

Antegrade flow in both ICA siphons. Evidence of bilateral siphon
atherosclerosis. But no hemodynamically significant siphon stenosis
is evident on these images. Patent carotid termini, MCA and ACA
origins.

Dominant left A1. Anterior communicating artery is within normal
limits. ACA A2 segments are tortuous, with mild to moderate left A2
stenosis on series [4L], image 6, but preserved distal flow.

Right MCA M1 segment and MCA bifurcation appear patent without
stenosis. Visible right MCA branches are within normal limits.

Left MCA M1 segment is patent. But there is irregularity at the left
carotid bifurcation (series [4L], image 8) and evidence of severe
stenosis or short segment occlusion of a posterior left M2 branch
(series [4L], image 4 and series 5, image 100). Code stroke. Right
side numbness and facial droop. There is also a moderate stenosis of
the left anterior temporal artery (series [4L], image 13). Other
visible left MCA branches appear to be patent.
IMPRESSION: 1. Positive for severe stenosis or short segment occlusion of a
posterior Left MCA M2 branch at the bifurcation, concordant with the
patchy acute ischemia by MRI. Additional mild to moderate stenosis
at the left MCA bifurcation.

2. Other intracranial atherosclerosis including up to Moderate
stenoses of the :
- left ACA A2,
- left Anterior Temporal Artery,
- left PCA P1/P2 junction

These results were communicated to Dr. [REDACTED] at [DATE]
on [DATE] by text page via the [employer] messaging system.

## 2021-05-29 MED ORDER — FLUTICASONE FUROATE-VILANTEROL 100-25 MCG/INH IN AEPB
1.0000 | INHALATION_SPRAY | Freq: Every day | RESPIRATORY_TRACT | Status: DC
  Administered 2021-05-30 – 2021-06-02 (×3): 1 via RESPIRATORY_TRACT
  Filled 2021-05-29: qty 28

## 2021-05-29 MED ORDER — SODIUM CHLORIDE 0.9% FLUSH
3.0000 mL | Freq: Once | INTRAVENOUS | Status: AC
  Administered 2021-05-29: 3 mL via INTRAVENOUS

## 2021-05-29 MED ORDER — ASPIRIN EC 325 MG PO TBEC
325.0000 mg | DELAYED_RELEASE_TABLET | Freq: Every day | ORAL | Status: DC
  Administered 2021-05-29 – 2021-06-02 (×5): 325 mg via ORAL
  Filled 2021-05-29 (×5): qty 1

## 2021-05-29 MED ORDER — ACETAMINOPHEN 650 MG RE SUPP: 650.0000 mg | RECTAL | Status: DC | PRN

## 2021-05-29 MED ORDER — MECLIZINE HCL 12.5 MG PO TABS: 12.5000 mg | ORAL_TABLET | Freq: Three times a day (TID) | ORAL | Status: DC | PRN

## 2021-05-29 MED ORDER — ASPIRIN EC 81 MG PO TBEC: 81.0000 mg | DELAYED_RELEASE_TABLET | Freq: Every day | ORAL | Status: DC

## 2021-05-29 MED ORDER — STROKE: EARLY STAGES OF RECOVERY BOOK: Freq: Once | Status: DC

## 2021-05-29 MED ORDER — LACTATED RINGERS IV BOLUS
500.0000 mL | Freq: Once | INTRAVENOUS | Status: AC
  Administered 2021-05-29: 500 mL via INTRAVENOUS

## 2021-05-29 MED ORDER — GADOBUTROL 1 MMOL/ML IV SOLN
8.0000 mL | Freq: Once | INTRAVENOUS | Status: AC | PRN
  Administered 2021-05-29: 8 mL via INTRAVENOUS

## 2021-05-29 MED ORDER — FLUTICASONE-UMECLIDIN-VILANT 200-62.5-25 MCG/INH IN AEPB: 1.0000 | INHALATION_SPRAY | Freq: Every day | RESPIRATORY_TRACT | Status: DC

## 2021-05-29 MED ORDER — ACETAMINOPHEN 160 MG/5ML PO SOLN: 650.0000 mg | ORAL | Status: DC | PRN

## 2021-05-29 MED ORDER — UMECLIDINIUM BROMIDE 62.5 MCG/INH IN AEPB
1.0000 | INHALATION_SPRAY | Freq: Every day | RESPIRATORY_TRACT | Status: DC
  Administered 2021-05-30 – 2021-06-02 (×3): 1 via RESPIRATORY_TRACT
  Filled 2021-05-29: qty 7

## 2021-05-29 MED ORDER — ACETAMINOPHEN 325 MG PO TABS: 650.0000 mg | ORAL_TABLET | ORAL | Status: DC | PRN

## 2021-05-29 MED ORDER — ATORVASTATIN CALCIUM 40 MG PO TABS: 80.0000 mg | ORAL_TABLET | Freq: Every day | ORAL | Status: DC

## 2021-05-29 MED ORDER — CLOPIDOGREL BISULFATE 75 MG PO TABS
75.0000 mg | ORAL_TABLET | Freq: Every day | ORAL | Status: DC
  Administered 2021-05-30 – 2021-06-02 (×4): 75 mg via ORAL
  Filled 2021-05-29 (×4): qty 1

## 2021-05-29 MED ORDER — TRAZODONE HCL 100 MG PO TABS
100.0000 mg | ORAL_TABLET | Freq: Every day | ORAL | Status: DC
  Administered 2021-05-29 – 2021-06-01 (×4): 100 mg via ORAL
  Filled 2021-05-29: qty 2
  Filled 2021-05-29 (×3): qty 1

## 2021-05-29 MED ORDER — LABETALOL HCL 5 MG/ML IV SOLN
5.0000 mg | INTRAVENOUS | Status: DC | PRN
  Administered 2021-05-29: 5 mg via INTRAVENOUS
  Filled 2021-05-29: qty 4

## 2021-05-29 MED ORDER — ENOXAPARIN SODIUM 30 MG/0.3ML IJ SOSY
30.0000 mg | PREFILLED_SYRINGE | INTRAMUSCULAR | Status: DC
  Administered 2021-05-29 – 2021-06-02 (×5): 30 mg via SUBCUTANEOUS
  Filled 2021-05-29 (×5): qty 0.3

## 2021-05-29 MED ORDER — CLOPIDOGREL BISULFATE 300 MG PO TABS
300.0000 mg | ORAL_TABLET | Freq: Once | ORAL | Status: AC
  Administered 2021-05-29: 300 mg via ORAL
  Filled 2021-05-29: qty 1

## 2021-05-29 MED ORDER — ATORVASTATIN CALCIUM 40 MG PO TABS
40.0000 mg | ORAL_TABLET | Freq: Every day | ORAL | Status: DC
  Administered 2021-05-29 – 2021-06-02 (×5): 40 mg via ORAL
  Filled 2021-05-29 (×5): qty 1

## 2021-05-29 MED ORDER — CLOPIDOGREL BISULFATE 75 MG PO TABS: 75.0000 mg | ORAL_TABLET | Freq: Every day | ORAL | Status: DC

## 2021-05-29 NOTE — Progress Notes (Signed)
  Echocardiogram 2D Echocardiogram has been performed.  Joan Carr 05/29/2021, 3:27 PM

## 2021-05-29 NOTE — ED Notes (Signed)
Breakfast Orders placed 

## 2021-05-29 NOTE — ED Provider Notes (Signed)
[facility] Provider Note  CSN: [id number] Arrival date & time: 05/29/21 0045  Chief Complaint(s) Code Stroke  HPI Joan Carr is a 87 y.o. female with a past medical history listed below who presents to the emergency department as a code stroke called by EMS.  Last known normal 2330p when she suddenly felt dizzy.  EMS was called.  Family gave patient 650 mg of aspirin.  In route patient developed word finding difficulty and right-sided numbness.    Patient has a history of prior stroke noted in the chart.  There was no deficits from that stroke..  Patient denies any recent fevers or infections.  No coughing or congestion.  No associated chest pain or shortness of breath.  No nausea or vomiting.  No diarrhea.  No urinary symptoms.  No other physical complaints.  HPI  Past Medical History Past Medical History:  Diagnosis Date   Atypical chest pain 07/25/2020   CHF (congestive heart failure) (HCC)    Diabetes ([place])    hx DM but undercontrolled   Dyslipidemia 07/25/2020   Essential hypertension 07/25/2020   Hypertension    Stroke (cerebrum) [facility])    Patient Active Problem List   Diagnosis Date Noted   Stroke (cerebrum) ([place])    Hypertension    Diabetes ([place])    Essential hypertension 07/25/2020   Atypical chest pain 07/25/2020   Dyslipidemia 07/25/2020   CHF (congestive heart failure) ([place])    Home Medication(s) Prior to Admission medications   Medication Sig Start Date End Date Taking? Authorizing Provider  amLODipine (NORVASC) 5 MG tablet Take 1 tablet (5 mg total) by mouth daily. 02/21/21   Park Liter, MD  carvedilol (COREG) 6.25 MG tablet Take 1 tablet (6.25 mg total) by mouth 2 (two) times daily. 02/21/21   Park Liter, MD  furosemide (LASIX) 20 MG tablet Take 1 tablet (20 mg total) by mouth daily. 02/21/21   Park Liter, MD  losartan (COZAAR) 100 MG tablet Take 1 tablet (100 mg total) by mouth daily.  02/21/21   Park Liter, MD  traMADol (ULTRAM) 50 MG tablet Take 50 mg by mouth every 4 (four) hours as needed. 04/03/20   [provider]  traZODone (DESYREL) 100 MG tablet Take 100 mg by mouth at bedtime. 09/26/18   [provider]  Donnal Debar 200-62.5-25 MCG/INH AEPB Inhale 1 puff into the lungs daily. 04/03/20   [provider]                                                                                                                                    Past Surgical History Past Surgical History:  Procedure Laterality Date   APPENDECTOMY     BACK SURGERY     Ovary removed  Right    WRIST FRACTURE SURGERY Right    Family History Family History  Problem Relation Age of  Onset   Cancer Father    Cancer Sister    Heart attack Brother    Cancer Brother    Heart disease Sister     Social History Social History   Tobacco Use   Smoking status: Former   Smokeless tobacco: Never  Substance Use Topics   Alcohol use: Not Currently   Drug use: Not Currently   Allergies Penicillins  Review of Systems Review of Systems All other systems are reviewed and are negative for acute change except as noted in the HPI  Physical Exam Vital Signs  I have reviewed the triage vital signs BP (!) 193/84   Pulse 86   Temp 98.4 F (36.9 C)   Resp 15   Ht 5\' 3"  (1.6 m)   Wt 82.4 kg   SpO2 93%   BMI 32.18 kg/m   Physical Exam Vitals reviewed.  Constitutional:      General: She is not in acute distress.    Appearance: She is well-developed. She is not diaphoretic.  HENT:     Head: Normocephalic and atraumatic.     Nose: Nose normal.  Eyes:     General: No scleral icterus.       Right eye: No discharge.        Left eye: No discharge.     Conjunctiva/sclera: Conjunctivae normal.     Pupils: Pupils are equal, round, and reactive to light.  Cardiovascular:     Rate and Rhythm: Normal rate and regular rhythm.     Heart sounds: No murmur heard.    No friction rub. No gallop.  Pulmonary:     Effort: Pulmonary effort is normal. No respiratory distress.     Breath sounds: Normal breath sounds. No stridor. No rales.  Abdominal:     General: There is no distension.     Palpations: Abdomen is soft.     Tenderness: There is no abdominal tenderness.  Musculoskeletal:        General: No tenderness.     Cervical back: Normal range of motion and neck supple.  Skin:    General: Skin is warm and dry.     Findings: No erythema or rash.  Neurological:     Mental Status: She is alert and oriented to person, place, and time.     Comments: Mild right facial droop. Right sided numbness. No pronator drift No appreciable weakness to extremities. Detailed neuro exam deferred to neurology    ED Results and Treatments Labs (all labs ordered are listed, but only abnormal results are displayed) Labs Reviewed  CBC - Abnormal; Notable for the following components:      Result Value   RBC 3.82 (*)    MCV 105.0 (*)    MCH 34.3 (*)    All other components within normal limits  COMPREHENSIVE METABOLIC PANEL - Abnormal; Notable for the following components:   Glucose, Bld 151 (*)    Creatinine, Ser 1.38 (*)    Calcium 8.8 (*)    Albumin 3.4 (*)    AST 14 (*)    GFR, Estimated 35 (*)    All other components within normal limits  I-STAT CHEM 8, ED - Abnormal; Notable for the following components:   Creatinine, Ser 1.30 (*)    Glucose, Bld 147 (*)    Calcium, Ion 1.01 (*)    All other components within normal limits  CBG MONITORING, ED - Abnormal; Notable for the following components:   Glucose-Capillary 143 (*)  All other components within normal limits  RESP PANEL BY RT-PCR (FLU A&B, COVID) ARPGX2  PROTIME-INR  APTT  DIFFERENTIAL  BRAIN NATRIURETIC PEPTIDE  TROPONIN I (HIGH SENSITIVITY)                                                                                                                         EKG  EKG  Interpretation  Date/Time:  Tuesday May 29 2021 01:09:13 EDT Ventricular Rate:  91 PR Interval:  173 QRS Duration: 139 QT Interval:  395 QTC Calculation: 486 R Axis:   -11 Text Interpretation: Sinus rhythm Ventricular premature complex Left bundle branch block No old tracing to compare Confirmed by Addison Lank [phone]) on 05/29/2021 1:12:49 AM       Radiology CT HEAD CODE STROKE WO CONTRAST  Result Date: 05/29/2021 CLINICAL DATA:  Code stroke.  Right-sided numbness and facial droop EXAM: CT HEAD WITHOUT CONTRAST TECHNIQUE: Contiguous axial images were obtained from the base of the skull through the vertex without intravenous contrast. COMPARISON:  03/22/2009 FINDINGS: Brain: No acute cortical infarction. No acute hemorrhage, hydrocephalus, extra-axial collection, mass, mass effect, or midline shift degree of cerebral atrophy is not unexpected for age. Periventricular white matter changes, likely the sequela of chronic small vessel ischemic disease. Right basal ganglia lacunar infarct. Vascular: No hyperdense vessel or unexpected calcification. Skull: Normal. Negative for fracture or focal lesion. Sinuses/Orbits: No acute finding. Status post bilateral lens replacements. Other: The mastoids are well aerated. ASPECTS [facility] Stroke Program Early CT Score) - Ganglionic level infarction (caudate, lentiform nuclei, internal capsule, insula, M1-M3 cortex): 7 - Supraganglionic infarction (M4-M6 cortex): 3 Total score (0-10 with 10 being normal): 10 IMPRESSION: 1. No acute intracranial process. 2. ASPECTS is 10 Code stroke imaging results were communicated on 05/29/2021 at 1:09 am to provider [facility] via secure text paging. Electronically Signed   By: Merilyn Baba M.D.   On: 05/29/2021 01:09    Pertinent labs & imaging results that were available during my care of the patient were reviewed by me and considered in my medical decision making (see MDM for details).  Medications Ordered in  ED Medications  sodium chloride flush (NS) 0.9 % injection 3 mL (3 mLs Intravenous Given 05/29/21 0130)  Procedures .1-3 Lead EKG Interpretation Performed by: Fatima Blank, MD Authorized by: Fatima Blank, MD     Interpretation: normal     ECG rate:  89   ECG rate assessment: normal     Rhythm: sinus rhythm     Ectopy: none     Conduction: normal    (including critical care time)  Medical Decision Making / ED Course I have reviewed the nursing notes for this encounter and the patient's prior records (if available in EHR or on provided paperwork).  Joan Carr was evaluated in Emergency Department on 05/29/2021 for the symptoms described in the history of present illness. She was evaluated in the context of the global COVID-19 pandemic, which necessitated consideration that the patient might be at risk for infection with the SARS-CoV-2 virus that causes COVID-19. Institutional protocols and algorithms that pertain to the evaluation of patients at risk for COVID-19 are in a state of rapid change based on information released by regulatory bodies including the CDC and federal and state organizations. These policies and algorithms were followed during the patient's care in the ED.     Code stroke. W/in window, but VAN negative. CT head negative.  Neuro felt she was not a good candidate for TNK. She will need admission for stroke work up and management.  Pertinent labs & imaging results that were available during my care of the patient were reviewed by me and considered in my medical decision making:  No leukocytosis or anemia.  No significant electrolyte derangements.  Patient does have mild renal insufficiency.  Patient and family updated.  Admitted to medicine.  Final Clinical Impression(s) / ED Diagnoses Final diagnoses:   Stroke [facility])     This chart was dictated using voice recognition software.  Despite best efforts to proofread,  errors can occur which can change the documentation meaning.    Fatima Blank, MD 05/29/21 [phone]

## 2021-05-29 NOTE — ED Triage Notes (Signed)
Pt presents to the ED from home via [facility] with complaints of slurred speech, right sided facial droop, right arm and leg numbness onset 2330 tonight. Family administered ASA 650mg  PTA of EMS. EMS called code stroke at [facility].

## 2021-05-29 NOTE — ED Notes (Signed)
Admitting MD at bedside.

## 2021-05-29 NOTE — Progress Notes (Addendum)
STROKE TEAM PROGRESS NOTE   ATTENDING NOTE: I reviewed above note and agree with the assessment and plan. Pt was seen and examined.   87 year old female with history of CHF, diabetes, hypertension, hyperlipidemia, stroke admitted for right facial droop and right arm numbness.  CT no acute abnormality.  MRI showed left MCA frontoparietal 3-4 small infarcts.  MRA head and neck severe stenosis versus short segment occlusion of left M2.  Moderate stenosis left A2, left P1/P2 and left anterior temporal artery.  EF 55 to 60% A1c 6.1, LDL 152.  Creatinine 1.30.  BP still elevated.  On exam, patient daughter at bedside.  Patient awake alert, able to tell me her first name, cannot tell me last name though.  Not orientated to age time but orientated to people.  Mild to moderate expressive aphasia, with limited words and language output, however follows simple commands.  Not able to name but able to repeat 3 word sentences.  No gaze palsy, blinking to visual threat bilaterally.  Mild right facial droop.  Bilateral upper and lower extremities strength equal and symmetrical.  Sensation subjectively symmetrical.  Finger-to-nose grossly intact bilaterally.  Etiology for patient stroke likely due to large vessel disease given left MCA M2 severe stenosis versus short segment occlusion.  However embolic pattern, cannot rule out cardioembolic source at this time.  Recommend outpatient 30-day cardiac event monitoring to rule out A. fib.  Continue aspirin 325 and Plavix 75 DAPT for 3 months and then Plavix alone given left M2 high-grade stenosis.  Continue Lipitor 40.  PT/OT recommend SNF.  For detailed assessment and plan, please refer to above as I have made changes wherever appropriate.   Neurology will sign off. Please call with questions. Pt will follow up with stroke clinic NP at [facility] in about 4 weeks. Thanks for the consult.   Joan Hawking, MD PhD Stroke Neurology 05/29/2021 4:02 PM    INTERVAL  HISTORY Patient is lying on stretcher resting, in NAD. No family present at the time of my exam. Patient is alert and oriented to name and place and unable to provide any history. No voiced complaints  Vitals:   05/29/21 1245 05/29/21 1300 05/29/21 1315 05/29/21 1345  BP: (!) 178/73 (!) 172/75 (!) 181/70 (!) 185/76  Pulse: 78 89 84 81  Resp: (!) 22 16 18 17   Temp:      SpO2: 96% 100% 98% 98%  Weight:      Height:       CBC:  Recent Labs  Lab 05/29/21 0048 05/29/21 0054  WBC 8.5  --   NEUTROABS 6.4  --   HGB 13.1 13.3  HCT 40.1 39.0  MCV 105.0*  --   PLT 291  --    Basic Metabolic Panel:  Recent Labs  Lab 05/29/21 0048 05/29/21 0054  NA 138 139  K 4.7 4.5  CL 107 109  CO2 22  --   GLUCOSE 151* 147*  BUN 21 22  CREATININE 1.38* 1.30*  CALCIUM 8.8*  --    Lipid Panel:  Recent Labs  Lab 05/29/21 0414  CHOL 214*  TRIG 78  HDL 46  CHOLHDL 4.7  VLDL 16  LDLCALC 152*   HgbA1c:  Recent Labs  Lab 05/29/21 0414  HGBA1C 6.1*   Urine Drug Screen: No results for input(s): LABOPIA, COCAINSCRNUR, LABBENZ, AMPHETMU, THCU, LABBARB in the last 168 hours.  Alcohol Level No results for input(s): ETH in the last 168 hours.  IMAGING past 24 hours MR  ANGIO HEAD WO CONTRAST  Result Date: 05/29/2021 CLINICAL DATA:  87 year old female code stroke. Right side numbness and facial droop. Patchy acute infarcts in the posterior left MCA territory. EXAM: MRA HEAD WITHOUT CONTRAST TECHNIQUE: Angiographic images of the Circle of Willis were acquired using MRA technique without intravenous contrast. COMPARISON:  Brain MRI and neck MRA today. FINDINGS: Antegrade flow in the posterior circulation. Codominant distal vertebral arteries and vertebrobasilar junction appear patent without stenosis. Normal right PICA origin. Patent basilar artery without stenosis. Patent SCA and PCA origins. Right PCA branches are patent with only mild irregularity. There is moderate irregularity and stenosis at  the junction of the left P1 and P2 segments on series 1013, image 12). Otherwise mild left PCA branch irregularity. Antegrade flow in both ICA siphons. Evidence of bilateral siphon atherosclerosis. But no hemodynamically significant siphon stenosis is evident on these images. Patent carotid termini, MCA and ACA origins. Dominant left A1. Anterior communicating artery is within normal limits. ACA A2 segments are tortuous, with mild to moderate left A2 stenosis on series 1007, image 6, but preserved distal flow. Right MCA M1 segment and MCA bifurcation appear patent without stenosis. Visible right MCA branches are within normal limits. Left MCA M1 segment is patent. But there is irregularity at the left carotid bifurcation (series 1007, image 8) and evidence of severe stenosis or short segment occlusion of a posterior left M2 branch (series 1003, image 4 and series 5, image 100). Code stroke. Right side numbness and facial droop. There is also a moderate stenosis of the left anterior temporal artery (series 1003, image 13). Other visible left MCA branches appear to be patent. IMPRESSION: 1. Positive for severe stenosis or short segment occlusion of a posterior Left MCA M2 branch at the bifurcation, concordant with the patchy acute ischemia by MRI. Additional mild to moderate stenosis at the left MCA bifurcation. 2. Other intracranial atherosclerosis including up to Moderate stenoses of the : - left ACA A2, - left Anterior Temporal Artery, - left PCA P1/P2 junction These results were communicated to Dr. Theda Sers Neurology at 5:20 am on 05/29/2021 by text page via the [facility] messaging system. Electronically Signed   By: Genevie Ann M.D.   On: 05/29/2021 05:20   MR MRA NECK W CONTRAST  Result Date: 05/29/2021 CLINICAL DATA:  87 year old female code stroke. Right side numbness and facial droop. EXAM: MRA NECK WITHOUT AND WITH CONTRAST TECHNIQUE: Angiographic images of the neck were acquired using MRA technique without and  with intravenous contrast. Carotid stenosis measurements (when applicable) are obtained utilizing NASCET criteria, using the distal internal carotid diameter as the denominator. CONTRAST:  21mL GADAVIST GADOBUTROL 1 MMOL/ML IV SOLN COMPARISON:  Brain MRI today. FINDINGS: Precontrast time-of-flight images demonstrate antegrade flow in the bilateral cervical carotid and vertebral arteries. Mild vessel tortuosity. Retropharyngeal course of the left carotid and bifurcation. Time-of-flight images suggest no hemodynamically significant stenosis. Postcontrast images demonstrate a 3 vessel arch configuration. Proximal great vessels are within normal limits. Mildly tortuous right CCA without stenosis. Patent right carotid bifurcation with only minor irregularity at the right ICA origin and bulb. Tortuous cervical right ICA without stenosis. Tortuous left CCA without stenosis. Patent left carotid bifurcation. Irregularity is mostly at the ECA origin. No proximal left ICA stenosis. Tortuous left ICA distal to the bulb with a kinked appearance (series 1015, image 5), but no other cervical left ICA stenosis. Proximal subclavian arteries and vertebral artery origins are patent. Mild bilateral vertebral origin stenosis suspected. Both vertebral arteries are  patent to the skull base without additional stenosis. Visible bilateral ICA siphons appear patent with irregularity greater on the left and maximal at the left supraclinoid segment suggesting mild-to-moderate stenosis there (series 1027, image 1). Patent carotid termini. Patent basilar artery. Moderate stenosis of the left PCA P1/P2 also suspected on series 1037, image 12. See intracranial MRA reported separately. IMPRESSION: 1. Arterial tortuosity in the neck with no hemodynamically significant stenosis other than a kinked appearance of the tortuous left ICA distal to the bulb. 2. Evidence of intracranial atherosclerosis, see Head MRA reported separately. Electronically Signed    By: Genevie Ann M.D.   On: 05/29/2021 05:12   MR BRAIN WO CONTRAST  Result Date: 05/29/2021 CLINICAL DATA:  87 year old female code stroke. Right side numbness and facial droop. EXAM: MRI HEAD WITHOUT CONTRAST TECHNIQUE: Multiplanar, multiecho pulse sequences of the brain and surrounding structures were obtained without intravenous contrast. COMPARISON:  Head CT 0054 hours today, and earlier. FINDINGS: Brain: Small discontinuous areas of cortical restricted diffusion in the posterior left MCA territory affecting the lateral and superior left parietal lobe. Patchy regional white matter involvement also. Minimal associated cytotoxic edema. No hemorrhage or mass effect. No other restricted diffusion identified. Small chronic infarcts in the bilateral cerebellum, bilateral deep gray matter nuclei and corona radiata. Patchy and confluent bilateral white matter T2 and FLAIR hyperintensity. Patchy T2 heterogeneity in the pons. No chronic cortical encephalomalacia or definite chronic cerebral blood products. No midline shift, mass effect, evidence of mass lesion, ventriculomegaly, extra-axial collection or acute intracranial hemorrhage. Cervicomedullary junction and pituitary are within normal limits. Vascular: Major intracranial vascular flow voids are preserved. See also MRA reported separately. Skull and upper cervical spine: Negative for age visible cervical spine. Hyperostosis of the calvarium (normal variant). Normal bone marrow signal. Sinuses/Orbits: Negative. Other: Visible internal auditory structures appear normal. Mastoids are well aerated. Negative visible scalp and face. IMPRESSION: 1. Small, patchy acute infarcts in the posterior Left MCA territory. No associated hemorrhage or mass effect. 2. Underlying advanced chronic small vessel disease. Electronically Signed   By: Genevie Ann M.D.   On: 05/29/2021 05:07   DG Chest Port 1 View  Result Date: 05/29/2021 CLINICAL DATA:  Slurred speech/stroke EXAM:  PORTABLE CHEST 1 VIEW COMPARISON:  CT chest dated 10/05/2020 FINDINGS: Prominent epicardial fat at the left lung base. Right lung is clear. No pleural effusion or pneumothorax. The heart is top-normal in size.  Thoracic aortic atherosclerosis. IMPRESSION: No evidence of acute cardiopulmonary disease. Electronically Signed   By: Julian Hy M.D.   On: 05/29/2021 02:27   CT HEAD CODE STROKE WO CONTRAST  Result Date: 05/29/2021 CLINICAL DATA:  Code stroke.  Right-sided numbness and facial droop EXAM: CT HEAD WITHOUT CONTRAST TECHNIQUE: Contiguous axial images were obtained from the base of the skull through the vertex without intravenous contrast. COMPARISON:  03/22/2009 FINDINGS: Brain: No acute cortical infarction. No acute hemorrhage, hydrocephalus, extra-axial collection, mass, mass effect, or midline shift degree of cerebral atrophy is not unexpected for age. Periventricular white matter changes, likely the sequela of chronic small vessel ischemic disease. Right basal ganglia lacunar infarct. Vascular: No hyperdense vessel or unexpected calcification. Skull: Normal. Negative for fracture or focal lesion. Sinuses/Orbits: No acute finding. Status post bilateral lens replacements. Other: The mastoids are well aerated. ASPECTS [facility] Stroke Program Early CT Score) - Ganglionic level infarction (caudate, lentiform nuclei, internal capsule, insula, M1-M3 cortex): 7 - Supraganglionic infarction (M4-M6 cortex): 3 Total score (0-10 with 10 being normal): 10 IMPRESSION: 1.  No acute intracranial process. 2. ASPECTS is 10 Code stroke imaging results were communicated on 05/29/2021 at 1:09 am to provider [facility] via secure text paging. Electronically Signed   By: Merilyn Baba M.D.   On: 05/29/2021 01:09    PHYSICAL EXAM  Temp:  [98.4 F (36.9 C)] 98.4 F (36.9 C) (10/18 0045) Pulse Rate:  [62-89] 81 (10/18 1345) Resp:  [15-24] 17 (10/18 1345) BP: (128-202)/(52-89) 185/76 (10/18 1345) SpO2:  [90 %-100 %]  98 % (10/18 1345) Weight:  [82.4 kg] 82.4 kg (10/18 0000)  General - Well nourished, well developed, in no apparent distress.  Ophthalmologic - fundi not visualized due to noncooperation.  Cardiovascular - Regular rhythm and rate.  Mental Status -  Level of arousal and orientation to person and place.  Language no dysarthria  Attention span and concentration were normal. Recent and remote memory were not intact.   Cranial Nerves II - XII - II - Visual field intact OU. III, IV, VI - Extraocular movements intact. V - Facial sensation intact bilaterally. VII - Right facial droop. VIII - Hearing & vestibular intact bilaterally. X - Palate elevates symmetrically. XI - Chin turning & shoulder shrug intact bilaterally. XII - Tongue protrusion intact.  Motor Strength - The patient's strength was normal in all extremities and pronator drift was absent.  Bulk was normal and fasciculations were absent.   Motor Tone - Muscle tone was assessed at the neck and appendages and was normal.  Sensory - Light touch, temperature/pinprick were assessed and were symmetrical.    Coordination - The patient had normal movements in the hands and feet with no ataxia or dysmetria.  Tremor was absent.  Gait and Station - deferred.   ASSESSMENT/PLAN [name] is a 87 y.o. female with history of CHF, HTN, Stroke, DM HLD who presented with slurred speech, right facial droop and right side numbness. Family noticed this and administered 650mg  ASA.   Stroke: Acute small patchy left MCA infarcts with left M2 severe stenosis/short segment occlusion, likely secondary to large vessel disease, although cardioembolic source can not be ruled out at this time.  Code stroke CTH No acute abnormality. ASPECTS 10.    MRI  head  Small, patchy acute infarcts in the posterior Left MCA territory. No associated hemorrhage or mass effect. 2. Underlying advanced chronic small vessel disease. MRA  head/neck: 1.  Arterial tortuosity in the neck with no hemodynamically significant stenosis other than a kinked appearance of the tortuous left ICA distal to the bulb. 2. Evidence of intracranial atherosclerosis, see Head MRA reported separately. 2D Echo pending  Consider 30 day cardiac event monitor as outpt LDL 152 HgbA1c 6.1 VTE prophylaxis - Lovenox and SCD's aspirin 325 mg daily prior to admission, now on aspirin 325 mg daily and clopidogrel 75 mg daily for 3 months given large vessel stenosis and then Plavix alone Therapy recommendations:  pending Disposition:  pending  Hypertension Home meds:  norvasc, coreg, losartan Stable on the high end Gradually decrease BP to goal within 2 to 3 days Long-term BP goal 130-150 given left MCA severe stenosis  Hyperlipidemia Home meds:  none LDL 152, goal < 70 Add atorvastatin 40mg    Continue statin at discharge  Other Stroke Risk Factors Advanced Age >/= 85  Obesity, Body mass index is 32.18 kg/m., BMI >/= 30 associated with increased stroke risk, recommend weight loss, diet and exercise as appropriate  Hx stroke/TIA Coronary artery disease Congestive heart failure   Hospital day #  0  Beulah Gandy, NP   To contact Stroke Continuity provider, please refer to [email or web address] After hours, contact General Neurology

## 2021-05-29 NOTE — Evaluation (Signed)
Physical Therapy Evaluation Patient Details Name: Joan Carr MRN: [id number] DOB: June 07, 1923 Today's Date: 05/29/2021  History of Present Illness  Pt is 87 yo female who presented to ED on 05/29/21 with R facial droop, R UE weakness and numbness.  Pt found to have posterior L MCA/lateral and superior L parietal lobe infarcts.  Additionally , pt with occlusion of posterior L MCA M2 branch.  Pt with hx of DM2, HTN, prior CVA  Clinical Impression  Pt admitted with above diagnosis. Pt is somewhat questionable historian and family not present.  At baseline, she reports she lives with son, typically does not use RW but has since she fell last week, and independent with ADLs.  Today, pt at times had difficulty expressing self, following complex commands, and demonstrated some cognitive deficits.  She did appear to have decreased sensation in R UE but did have difficulty with testing.  She presented with generalized weakness throughout, decreased balance, and decreased mobility. Pt currently with functional limitations due to the deficits listed below (see PT Problem List). Pt will benefit from skilled PT to increase their independence and safety with mobility to allow discharge to the venue listed below.          Recommendations for follow up therapy are one component of a multi-disciplinary discharge planning process, led by the attending physician.  Recommendations may be updated based on patient status, additional functional criteria and insurance authorization.  Follow Up Recommendations SNF    Equipment Recommendations  Wheelchair cushion (measurements PT);Wheelchair (measurements PT) (further assessment post acute)    Recommendations for Other Services       Precautions / Restrictions Precautions Precautions: Fall      Mobility  Bed Mobility Overal bed mobility: Needs Assistance Bed Mobility: Supine to Sit;Sit to Supine     Supine to sit: Mod assist Sit to supine: Mod  assist   General bed mobility comments: Requiring cues, assist for legs and trunk into and out of bed    Transfers Overall transfer level: Needs assistance Equipment used: 1 person hand held assist Transfers: Sit to/from Stand Sit to Stand: Mod assist         General transfer comment: Mod A to steady with standing; cues for hand placement  Ambulation/Gait Ambulation/Gait assistance: Mod assist Gait Distance (Feet): 3 Feet Assistive device: 1 person hand held assist Gait Pattern/deviations: Step-to pattern;Decreased stride length;Shuffle Gait velocity: decreased   General Gait Details: Therapist stood in front of pt and pt held therapist arms to allow improved guarding.  Took a few steps forward and back - pt unsteady, shuffling, reports dizziness  Stairs            Wheelchair Mobility    Modified Rankin (Stroke Patients Only) Modified Rankin (Stroke Patients Only) Pre-Morbid Rankin Score: Slight disability Modified Rankin: Moderately severe disability     Balance Overall balance assessment: Needs assistance Sitting-balance support: Single extremity supported;No upper extremity supported;Feet unsupported Sitting balance-Leahy Scale: Fair Sitting balance - Comments: With fatigue needed UE support but was also on stretcher with feet not touching floor   Standing balance support: Bilateral upper extremity supported Standing balance-Leahy Scale: Poor Standing balance comment: Requiring UE support and at least min A                             Pertinent Vitals/Pain Pain Assessment: No/denies pain    Home Living Family/patient expects to be discharged to:: Unsure Living Arrangements: Children (  son) Available Help at Discharge: Family;Available PRN/intermittently Type of Home: House Home Access: Stairs to enter Entrance Stairs-Rails: None Entrance Stairs-Number of Steps: 2 Home Layout: One level Home Equipment: Walker - 2 wheels      Prior  Function Level of Independence: Needs assistance   Gait / Transfers Assistance Needed: Doesn't typically use RW or cane but fell last week and using RW since. Can normally ambulate short community distances  ADL's / Homemaking Assistance Needed: Independent with ADLs; Pt may do some light IADLs but children mostly do IADLs  Comments: Pt somewhat questionable historian and no family present     Hand Dominance        Extremity/Trunk Assessment   Upper Extremity Assessment Upper Extremity Assessment: LUE deficits/detail;RUE deficits/detail RUE Deficits / Details: ROM: some shoulder elevation deficits to ~90 degrees, otherwise WFL.  MMT: 3/5 shoulder, 4-/5 elbow, 4-/5 hand and wrist RUE Sensation: decreased light touch;decreased proprioception (Some difficulty with testing, tried multiple methods (comparing sides, telling area therapist touched, touching area therapist touched).  Some inconsistencies but does seem decreased in R UE.) RUE Coordination: decreased fine motor LUE Deficits / Details: ROM: some shoulder elevation deficits to ~90 degrees, otherwise WFL.  MMT: 3/5 shoulder, 4-/5 elbow, 4-/5 hand and wrist    Lower Extremity Assessment Lower Extremity Assessment: LLE deficits/detail;RLE deficits/detail RLE Deficits / Details: ROM WFL; MMT 4/5 LLE Deficits / Details: ROM WFL; MMT 4/5    Cervical / Trunk Assessment Cervical / Trunk Assessment: Normal  Communication   Communication: Expressive difficulties (Pt slow to respond and unable at times)  Cognition Arousal/Alertness: Awake/alert Behavior During Therapy: WFL for tasks assessed/performed Overall Cognitive Status: No family/caregiver present to determine baseline cognitive functioning                                 General Comments: Pt is inconsistent in her cognitive presentation/expressive difficulties.  She was able to state name, president, and provided her PLOF without too much difficulty.  But  required increased time and at times unable to answer other questions like month/year (with choices), did she have pain, telling her symptoms.  Unsure if due to cognition or difficulty with word finding.  Did have difficulty with more complex testing like sensation/prioproception      General Comments      Exercises     Assessment/Plan    PT Assessment Patient needs continued PT services  PT Problem List Decreased strength;Decreased mobility;Decreased range of motion;Decreased coordination;Decreased activity tolerance;Decreased cognition;Decreased balance;Decreased knowledge of use of DME;Impaired sensation       PT Treatment Interventions DME instruction;Therapeutic exercise;Gait training;Balance training;Neuromuscular re-education;Functional mobility training;Therapeutic activities;Cognitive remediation;Patient/family education    PT Goals (Current goals can be found in the Care Plan section)  Acute Rehab PT Goals Patient Stated Goal: rest PT Goal Formulation: With patient Time For Goal Achievement: 06/12/21 Potential to Achieve Goals: Good    Frequency Min 3X/week   Barriers to discharge        Co-evaluation               AM-PAC PT "6 Clicks" Mobility  Outcome Measure Help needed turning from your back to your side while in a flat bed without using bedrails?: A Little Help needed moving from lying on your back to sitting on the side of a flat bed without using bedrails?: A Lot Help needed moving to and from a bed to a chair (including a  wheelchair)?: A Lot Help needed standing up from a chair using your arms (e.g., wheelchair or bedside chair)?: A Lot Help needed to walk in hospital room?: A Lot Help needed climbing 3-5 steps with a railing? : Total 6 Click Score: 12    End of Session Equipment Utilized During Treatment: Gait belt Activity Tolerance: Patient tolerated treatment well Patient left: in bed;with call bell/phone within reach (in ED on stretcher)    PT Visit Diagnosis: Unsteadiness on feet (R26.81);Muscle weakness (generalized) (M62.81);Hemiplegia and hemiparesis Hemiplegia - Right/Left: Right Hemiplegia - dominant/non-dominant: Dominant Hemiplegia - caused by: Cerebral infarction    Time: 8242-3536 PT Time Calculation (min) (ACUTE ONLY): 22 min   Charges:   PT Evaluation $PT Eval Moderate Complexity: 1 Melina Schools, PT Acute Rehab Services Pager [phone] [facility] [place] 05/29/2021, 11:35 AM

## 2021-05-29 NOTE — ED Notes (Signed)
Patient transported to MRI 

## 2021-05-29 NOTE — ED Notes (Signed)
Joan Carr [phone], daughter, contact for updates. Will be staying with pt during admission.

## 2021-05-29 NOTE — Consult Note (Addendum)
Neurology Consult H&P  Joan Carr MR# [id number] 05/29/2021   CC: code stroke  History is obtained from: EMS, patient and chart.  HPI: Joan Carr is a 87 y.o. female PMHx as reviewed below developed slurred speech, right sided facial droop and right /extremity numbness ~2330 (05/28/2021). Her family noticed this and administered aspirin 650mg     LKW: 2030 tNK given: No low nihs and nondisabling IR Thrombectomy No,  Modified Rankin Scale: 0-Completely asymptomatic and back to baseline post- stroke NIHSS: 2  LOC Responsiveness 0 LOC Questions 0 LOC Commands 0 Horizontal eye movement 0 Visual field 0 Facial palsy 1 Motor arm - Right arm 0 Motor arm - Left arm 0 Motor leg - Right leg 0 Motor leg - Left leg 0 Limb ataxia 0 Sensory test 1 Language 0 Speech 0 Extinction and inattention 0  ROS: A complete ROS was performed and is negative except as noted in the HPI.   Past Medical History:  Diagnosis Date   Atypical chest pain 07/25/2020   CHF (congestive heart failure) ([place])    Diabetes ([place])    hx DM but undercontrolled   Dyslipidemia 07/25/2020   Essential hypertension 07/25/2020   Hypertension    Stroke (cerebrum) ([place])    Family History  Problem Relation Age of Onset   Cancer Father    Cancer Sister    Heart attack Brother    Cancer Brother    Heart disease Sister    Social History:  reports that she has quit smoking. She has never used smokeless tobacco. She reports that she does not currently use alcohol. She reports that she does not currently use drugs.  Prior to Admission medications   Medication Sig Start Date End Date Taking? Authorizing Provider  amLODipine (NORVASC) 5 MG tablet Take 1 tablet (5 mg total) by mouth daily. 02/21/21   Park Liter, MD  carvedilol (COREG) 6.25 MG tablet Take 1 tablet (6.25 mg total) by mouth 2 (two) times daily. 02/21/21   Park Liter, MD  furosemide (LASIX) 20 MG tablet Take 1 tablet (20 mg  total) by mouth daily. 02/21/21   Park Liter, MD  losartan (COZAAR) 100 MG tablet Take 1 tablet (100 mg total) by mouth daily. 02/21/21   Park Liter, MD  traMADol (ULTRAM) 50 MG tablet Take 50 mg by mouth every 4 (four) hours as needed. 04/03/20   [provider]  traZODone (DESYREL) 100 MG tablet Take 100 mg by mouth at bedtime. 09/26/18   [provider]  Donnal Debar 200-62.5-25 MCG/INH AEPB Inhale 1 puff into the lungs daily. 04/03/20   [provider]    Exam: Current vital signs: BP (!) 193/84   Pulse 86   Temp 98.4 F (36.9 C)   Resp 15   Ht 5\' 3"  (1.6 m)   Wt 82.4 kg   SpO2 93%   BMI 32.18 kg/m   Physical Exam  Constitutional: Appears well-developed and well-nourished.  Psych: Affect appropriate to situation Eyes: No scleral injection HENT: No OP obstruction. Head: Normocephalic.  Cardiovascular: Normal rate and regular rhythm.  Respiratory: Effort normal, symmetric excursions bilaterally, no audible wheezing. GI: Soft.  No distension. There is no tenderness.  Skin: WDI  Neuro: Mental Status: Patient is awake, alert, oriented to person, place, year. Patient is able to give a clear and coherent history. Speech fluent, intact comprehension and repetition. No signs of aphasia or neglect. Visual Fields are full. Pupils are equal, round, and  reactive to light. EOMI without ptosis or diploplia.  Facial movement is symmetric.  Hearing is intact to voice. Uvula midline and palate elevates symmetrically. Shoulder shrug is symmetric. Tongue is midline without atrophy or fasciculations.  Tone is normal. Bulk is normal. ~5/5 strength was present in all four extremities. Sensation is decreased to temperature right face and arm. Deep Tendon Reflexes: 2+ and symmetric in the biceps and patellae. Toes are downgoing bilaterally. FNF and HKS are intact bilaterally. Gait - Deferred  I have reviewed labs in epic and the pertinent  results are: None available  I have reviewed the images obtained: NCT head showed no acute ischemic changes and chronic appearing right basal ganglia lacunar infarction.  Assessment: Joan Carr is a 87 y.o. female PMHx as noted above with acute mild right lower face droop and decreased cold sensation in the right face and arm. Deficits may have worse at onset and her family administered aspirin 650mg  PTA. SBP ~195. Her exam only showed mild deficits that were not disabling and therefore tNK was not administered.    Addendum: MRI brain showed small patchy infraction in the posterior left MCA territory/lateral and superior left parietal lobe area. MRA head and neck showed short segment occlusion of a posterior Left MCA M2 branch at the bifurcation which correlates with area of stroke. There is also moderate stenosis of the left PCA P1/P2. Exam remains unchanged. The patient was discussed with Dr. Estanislado Pandy and we will continue to observe.  Impression:  Acute ischemic stroke - likely hypertensive. Hypertensive emergency. NIHSS 2. Intracranial atherosclerosis. Chronic right basal ganglia stroke. HTN HLD DM 2   Plan: - Admit to hospitalist progressive. - Lay the patient flat. - Recommend TTE. - Recommend labs: HbA1c, lipid panel, TSH. - Recommend Statin if LDL > 70 - Continue aspirin 325mg  daily. - Clopidogrel 300mg  load then 75mg  daily for 3 months. - Permissive hypertension - later exam showed SBP 120 and she received 500cc bolus. - If the patient has significant decline in neuro exam please STAT CT head and call neurology. - Telemetry monitoring for arrhythmia. - Recommend bedside Swallow screen. - Recommend Stroke education. - Recommend PT/OT/SLP consult.  This patient is critically ill and at significant risk of neurological worsening, death and care requires constant monitoring of vital signs, hemodynamics,respiratory and cardiac monitoring, neurological assessment,  discussion with family, other specialists and medical decision making of high complexity. I spent 125 minutes of neurocritical care time  in the care of  this patient. This was time spent independent of any time provided by nurse practitioner or PA.  Electronically signed by:  Lynnae Sandhoff, MD Page: [phone] 05/29/2021, 1:40 AM

## 2021-05-29 NOTE — Progress Notes (Addendum)
This is a 87 year old past medical history of prior stroke, hypertension and diabetes who was brought into the emergency department with facial droop and right upper extremity weakness as a code stroke and was subsequently diagnosed with acute Ischemic stroke in the posterior left MCA territory/lateral and superior left parietal lobe area.  MRA head and neck showed short segment occlusion of the posterior left MCA M2 branch at the bifurcation which correlates with area of stroke.  Patient seen and examined the ED.  Patient keeps repeating herself.  Appears to be having acute encephalopathy secondary to stroke.  Following commands.  Has right facial droop.  Sensation decreased in the right face and arm.  Slightly decreased strength as well.  Her blood pressure is at goal, neurology recommends to keep systolic less than 010.  She remains on labetalol as needed for that.  Neurology following.  Patient already on aspirin and Plavix.  I will add atorvastatin.  Will defer further management to neurology.  PT OT and SLP on board.  It appears that no family communication was made and patient was admitted as full code.  She is 87 year old.  I tried calling patient's daughter Margaretha Sheffield and left voicemail.  We will keep full code until further clarification.  Addendum: I was able to talk to her other daughter Laural Roes over the phone.  Verified CODE STATUS, she told me that patient had elected to be DNR and has paperwork done.  We will change the CODE STATUS to DNR.  All questions answered.

## 2021-05-29 NOTE — H&P (Addendum)
History and Physical    [name] PNT:[id number] DOB: 07-17-1923 DOA: 05/29/2021  PCP: Renaldo Reel, PA  Patient coming from: Home  I have personally briefly reviewed patient's old medical records in [place]  Chief Complaint: Code Stroke  HPI: Joan Carr is a 87 y.o. female with medical history significant of DM2, HTN, prior stroke.  Pt presents to ED after she developed R facial droop, RUE numbness at about 2330.  Family noticed this, gave ASA 650.  Pt to ED emergently as code stroke.  No fever, cough, congestion, CP, SOB.   ED Course: Symptoms persistent in ED.  NIHSS = 2.  Pt not felt to be candidate for tNK.  CTH = nothing acute.  MRI brain pending.   Review of Systems: As per HPI, otherwise all review of systems negative.  Past Medical History:  Diagnosis Date   Atypical chest pain 07/25/2020   CHF (congestive heart failure) ([place])    Diabetes ([place])    hx DM but undercontrolled   Dyslipidemia 07/25/2020   Essential hypertension 07/25/2020   Hypertension    Stroke (cerebrum) [facility])     Past Surgical History:  Procedure Laterality Date   APPENDECTOMY     BACK SURGERY     Ovary removed  Right    WRIST FRACTURE SURGERY Right      reports that she has quit smoking. She has never used smokeless tobacco. She reports that she does not currently use alcohol. She reports that she does not currently use drugs.  Allergies  Allergen Reactions   Penicillins Hives    Family History  Problem Relation Age of Onset   Cancer Father    Cancer Sister    Heart attack Brother    Cancer Brother    Heart disease Sister      Prior to Admission medications   Medication Sig Start Date End Date Taking? Authorizing Provider  acetaminophen (TYLENOL) 500 MG tablet Take 500 mg by mouth every 6 (six) hours as needed for moderate pain or headache.   Yes [provider]  amLODipine (NORVASC) 5 MG tablet Take 1 tablet (5 mg total) by mouth daily.  02/21/21  Yes Park Liter, MD  carvedilol (COREG) 6.25 MG tablet Take 1 tablet (6.25 mg total) by mouth 2 (two) times daily. 02/21/21  Yes Park Liter, MD  furosemide (LASIX) 20 MG tablet Take 1 tablet (20 mg total) by mouth daily. 02/21/21  Yes Park Liter, MD  losartan (COZAAR) 100 MG tablet Take 1 tablet (100 mg total) by mouth daily. 02/21/21  Yes Park Liter, MD  meclizine (ANTIVERT) 12.5 MG tablet Take 12.5 mg by mouth every 8 (eight) hours as needed for dizziness.   Yes [provider]  traMADol (ULTRAM) 50 MG tablet Take 50 mg by mouth every 4 (four) hours as needed for moderate pain. 04/03/20  Yes [provider]  traZODone (DESYREL) 100 MG tablet Take 100 mg by mouth at bedtime. 09/26/18  Yes [provider]  Donnal Debar 200-62.5-25 MCG/INH AEPB Inhale 1 puff into the lungs daily. 04/03/20  Yes [provider]  aspirin EC 325 MG tablet Take 650 mg by mouth once. For chest pain 05/29/21 Patient not taking: Reported on 05/29/2021    [provider]    Physical Exam: Vitals:   05/29/21 0115 05/29/21 0130 05/29/21 0145 05/29/21 0200  BP: (!) 189/85 (!) 193/84 (!) 191/74 (!) 181/72  Pulse: 88 86 84 86  Resp: 17 15 20 18   Temp:      SpO2: 95% 93% 94% 90%  Weight:      Height:        Constitutional: NAD, calm, comfortable Eyes: PERRL, lids and conjunctivae normal ENMT: Mucous membranes are moist. Posterior pharynx clear of any exudate or lesions.Normal dentition.  Neck: normal, supple, no masses, no thyromegaly Respiratory: clear to auscultation bilaterally, no wheezing, no crackles. Normal respiratory effort. No accessory muscle use.  Cardiovascular: Regular rate and rhythm, no murmurs / rubs / gallops. No extremity edema. 2+ pedal pulses. No carotid bruits.  Abdomen: no tenderness, no masses palpated. No hepatosplenomegaly. Bowel sounds positive.  Musculoskeletal: no clubbing / cyanosis. No joint deformity  upper and lower extremities. Good ROM, no contractures. Normal muscle tone.  Skin: no rashes, lesions, ulcers. No induration Neurologic: sensation diminished R face and arm.  Strength 5/5 in all 4.  No aphasia nor neglect. Psychiatric: Normal judgment and insight. Alert and oriented x 3. Normal mood.    Labs on Admission: I have personally reviewed following labs and imaging studies  CBC: Recent Labs  Lab 05/29/21 0048 05/29/21 0054  WBC 8.5  --   NEUTROABS 6.4  --   HGB 13.1 13.3  HCT 40.1 39.0  MCV 105.0*  --   PLT 291  --    Basic Metabolic Panel: Recent Labs  Lab 05/29/21 0048 05/29/21 0054  NA 138 139  K 4.7 4.5  CL 107 109  CO2 22  --   GLUCOSE 151* 147*  BUN 21 22  CREATININE 1.38* 1.30*  CALCIUM 8.8*  --    GFR: Estimated Creatinine Clearance: 25.7 mL/min (A) (by C-G formula based on SCr of 1.3 mg/dL (H)). Liver Function Tests: Recent Labs  Lab 05/29/21 0048  AST 14*  ALT 11  ALKPHOS 49  BILITOT 0.6  PROT 6.6  ALBUMIN 3.4*   No results for input(s): LIPASE, AMYLASE in the last 168 hours. No results for input(s): AMMONIA in the last 168 hours. Coagulation Profile: Recent Labs  Lab 05/29/21 0048  INR 1.0   Cardiac Enzymes: No results for input(s): CKTOTAL, CKMB, CKMBINDEX, TROPONINI in the last 168 hours. BNP (last 3 results) No results for input(s): PROBNP in the last 8760 hours. HbA1C: No results for input(s): HGBA1C in the last 72 hours. CBG: Recent Labs  Lab 05/29/21 0047  GLUCAP 143*   Lipid Profile: No results for input(s): CHOL, HDL, LDLCALC, TRIG, CHOLHDL, LDLDIRECT in the last 72 hours. Thyroid Function Tests: No results for input(s): TSH, T4TOTAL, FREET4, T3FREE, THYROIDAB in the last 72 hours. Anemia Panel: No results for input(s): VITAMINB12, FOLATE, FERRITIN, TIBC, IRON, RETICCTPCT in the last 72 hours. Urine analysis: No results found for: COLORURINE, APPEARANCEUR, LABSPEC, [place], GLUCOSEU, HGBUR, BILIRUBINUR, KETONESUR,  PROTEINUR, UROBILINOGEN, NITRITE, LEUKOCYTESUR  Radiological Exams on Admission: DG Chest Port 1 View  Result Date: 05/29/2021 CLINICAL DATA:  Slurred speech/stroke EXAM: PORTABLE CHEST 1 VIEW COMPARISON:  CT chest dated 10/05/2020 FINDINGS: Prominent epicardial fat at the left lung base. Right lung is clear. No pleural effusion or pneumothorax. The heart is top-normal in size.  Thoracic aortic atherosclerosis. IMPRESSION: No evidence of acute cardiopulmonary disease. Electronically Signed   By: Julian Hy M.D.   On: 05/29/2021 02:27   CT HEAD CODE STROKE WO CONTRAST  Result Date: 05/29/2021 CLINICAL DATA:  Code stroke.  Right-sided numbness and facial droop EXAM: CT HEAD WITHOUT CONTRAST TECHNIQUE: Contiguous axial images were obtained from the base of the  skull through the vertex without intravenous contrast. COMPARISON:  03/22/2009 FINDINGS: Brain: No acute cortical infarction. No acute hemorrhage, hydrocephalus, extra-axial collection, mass, mass effect, or midline shift degree of cerebral atrophy is not unexpected for age. Periventricular white matter changes, likely the sequela of chronic small vessel ischemic disease. Right basal ganglia lacunar infarct. Vascular: No hyperdense vessel or unexpected calcification. Skull: Normal. Negative for fracture or focal lesion. Sinuses/Orbits: No acute finding. Status post bilateral lens replacements. Other: The mastoids are well aerated. ASPECTS [facility] Stroke Program Early CT Score) - Ganglionic level infarction (caudate, lentiform nuclei, internal capsule, insula, M1-M3 cortex): 7 - Supraganglionic infarction (M4-M6 cortex): 3 Total score (0-10 with 10 being normal): 10 IMPRESSION: 1. No acute intracranial process. 2. ASPECTS is 10 Code stroke imaging results were communicated on 05/29/2021 at 1:09 am to provider [facility] via secure text paging. Electronically Signed   By: Merilyn Baba M.D.   On: 05/29/2021 01:09    EKG: Independently  reviewed.  Assessment/Plan Principal Problem:   Acute ischemic stroke [facility]) Active Problems:   Essential hypertension   Dyslipidemia   Diabetes ([place])    Acute ischemic stroke - Stroke pathway MRI brain, MRA head and neck TTE A1C, FLP, TSH PT/OT/SLP Tele monitor 2d echo ASA 81 daily and plavix 75 for 3 weeks. HTN - Neurologist thinks HTN induced stroke Wants SBP < 180 Putting in PRN labetalol if needed Hold home BP meds HLD - FLP pending Add statin if needed DM2 - A1C pending Not on any chronic home meds currently Will check CBGs AC/HS  DVT prophylaxis: Lovenox Code Status: Full Family Communication: No family in room Disposition Plan: Home after stroke workup Consults called: Neuro Admission status: Place in 58   Danzel Marszalek, [place] [facility]  How to contact the [facility] Attending or Consulting provider [place] or covering provider during after hours [place], for this patient?  Check the care team in [facility] and look for a) attending/consulting [facility] provider listed and b) the [facility] team listed Log into [email or web address]  Amion Physician Scheduling and messaging for groups and whole hospitals  On call and physician scheduling software for group practices, residents, hospitalists and other medical providers for call, clinic, rotation and shift schedules. On[employer] is a hospital-wide system for scheduling doctors and paging doctors on call. EasyPlot is for scientific plotting and data analysis.  [email or web address]  and use [place]'s universal password to access. If you do not have the password, please contact the hospital operator.  Locate the [facility] provider you are looking for under [facility] and page to a number that you can be directly reached. If you still have difficulty reaching the provider, please page the [facility] (Director on Call) for the Hospitalists listed on amion for assistance.  05/29/2021, 2:56 AM

## 2021-05-29 NOTE — ED Notes (Addendum)
Pt has right sided facial droop that per shift before was not present. Will notify neuro and admitting.

## 2021-05-30 ENCOUNTER — Inpatient Hospital Stay (HOSPITAL_COMMUNITY): Payer: Medicare Other

## 2021-05-30 ENCOUNTER — Telehealth: Payer: Self-pay

## 2021-05-30 DIAGNOSIS — R471 Dysarthria and anarthria: Secondary | ICD-10-CM | POA: Diagnosis present

## 2021-05-30 DIAGNOSIS — N179 Acute kidney failure, unspecified: Secondary | ICD-10-CM | POA: Diagnosis present

## 2021-05-30 DIAGNOSIS — I69392 Facial weakness following cerebral infarction: Secondary | ICD-10-CM | POA: Diagnosis not present

## 2021-05-30 DIAGNOSIS — R4701 Aphasia: Secondary | ICD-10-CM | POA: Diagnosis present

## 2021-05-30 DIAGNOSIS — Z6832 Body mass index (BMI) 32.0-32.9, adult: Secondary | ICD-10-CM | POA: Diagnosis not present

## 2021-05-30 DIAGNOSIS — R29702 NIHSS score 2: Secondary | ICD-10-CM | POA: Diagnosis present

## 2021-05-30 DIAGNOSIS — Z8249 Family history of ischemic heart disease and other diseases of the circulatory system: Secondary | ICD-10-CM | POA: Diagnosis not present

## 2021-05-30 DIAGNOSIS — Z87891 Personal history of nicotine dependence: Secondary | ICD-10-CM | POA: Diagnosis not present

## 2021-05-30 DIAGNOSIS — E785 Hyperlipidemia, unspecified: Secondary | ICD-10-CM | POA: Diagnosis present

## 2021-05-30 DIAGNOSIS — Z880 Allergy status to penicillin: Secondary | ICD-10-CM | POA: Diagnosis not present

## 2021-05-30 DIAGNOSIS — I63512 Cerebral infarction due to unspecified occlusion or stenosis of left middle cerebral artery: Secondary | ICD-10-CM | POA: Diagnosis present

## 2021-05-30 DIAGNOSIS — F0390 Unspecified dementia without behavioral disturbance: Secondary | ICD-10-CM | POA: Diagnosis present

## 2021-05-30 DIAGNOSIS — I161 Hypertensive emergency: Secondary | ICD-10-CM | POA: Diagnosis present

## 2021-05-30 DIAGNOSIS — I672 Cerebral atherosclerosis: Secondary | ICD-10-CM | POA: Diagnosis present

## 2021-05-30 DIAGNOSIS — I10 Essential (primary) hypertension: Secondary | ICD-10-CM | POA: Diagnosis present

## 2021-05-30 DIAGNOSIS — Z79899 Other long term (current) drug therapy: Secondary | ICD-10-CM | POA: Diagnosis not present

## 2021-05-30 DIAGNOSIS — G9341 Metabolic encephalopathy: Secondary | ICD-10-CM | POA: Diagnosis present

## 2021-05-30 DIAGNOSIS — Z20822 Contact with and (suspected) exposure to covid-19: Secondary | ICD-10-CM | POA: Diagnosis present

## 2021-05-30 DIAGNOSIS — R7303 Prediabetes: Secondary | ICD-10-CM | POA: Diagnosis present

## 2021-05-30 DIAGNOSIS — Z66 Do not resuscitate: Secondary | ICD-10-CM | POA: Diagnosis not present

## 2021-05-30 DIAGNOSIS — Z9049 Acquired absence of other specified parts of digestive tract: Secondary | ICD-10-CM | POA: Diagnosis not present

## 2021-05-30 DIAGNOSIS — I959 Hypotension, unspecified: Secondary | ICD-10-CM | POA: Diagnosis not present

## 2021-05-30 DIAGNOSIS — I639 Cerebral infarction, unspecified: Secondary | ICD-10-CM | POA: Diagnosis present

## 2021-05-30 LAB — BASIC METABOLIC PANEL WITH GFR
Anion gap: 8 (ref 5–15)
BUN: 14 mg/dL (ref 8–23)
CO2: 24 mmol/L (ref 22–32)
Calcium: 8.6 mg/dL — ABNORMAL LOW (ref 8.9–10.3)
Chloride: 107 mmol/L (ref 98–111)
Creatinine, Ser: 0.96 mg/dL (ref 0.44–1.00)
GFR, Estimated: 54 mL/min — ABNORMAL LOW
Glucose, Bld: 117 mg/dL — ABNORMAL HIGH (ref 70–99)
Potassium: 3.9 mmol/L (ref 3.5–5.1)
Sodium: 139 mmol/L (ref 135–145)

## 2021-05-30 IMAGING — MR MR HEAD W/O CM
5 series · 43 of 48 positions shown · non-contrast
Comparison: [DATE] [DATE] a.m.

CLINICAL DATA: Stroke, follow-up

EXAM:
MRI HEAD WITHOUT CONTRAST
TECHNIQUE: Multiplanar, multiecho pulse sequences of the brain and surrounding
structures were obtained without intravenous contrast.

[Series 2: DWI · axial · 3.0mm · 0.94mm/px · z∈[-90,+49]mm · 13 of 93 slices shown (1 of 2)]
[im 1/93]
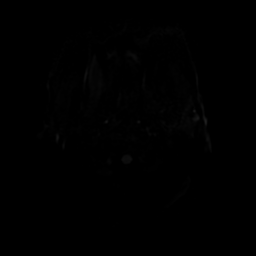
[im 8/93]
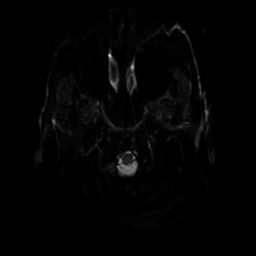
[im 16/93]
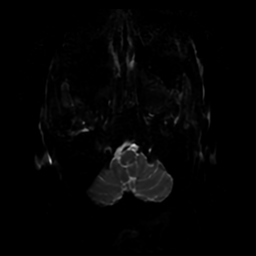
[im 24/93]
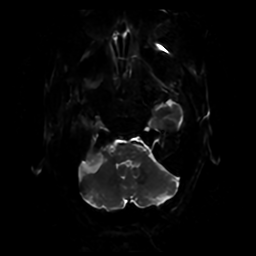
[im 31/93]
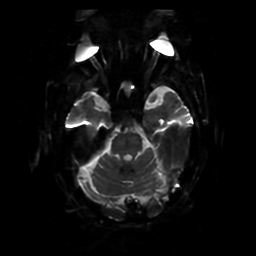
[im 39/93]
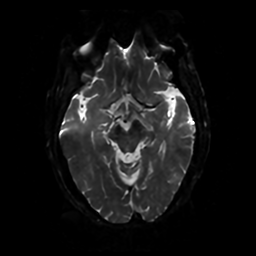
[im 47/93]
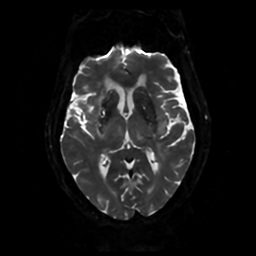
[im 54/93]
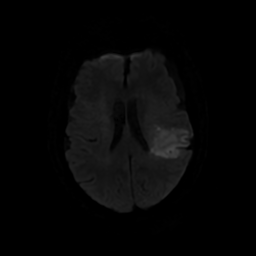
[im 62/93]
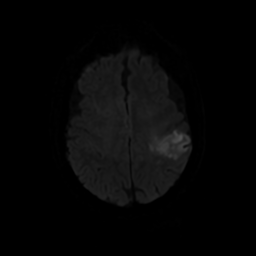
[im 70/93]
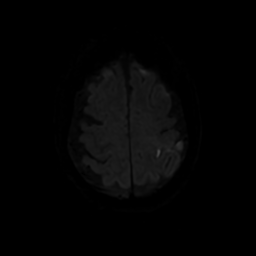
[im 77/93]
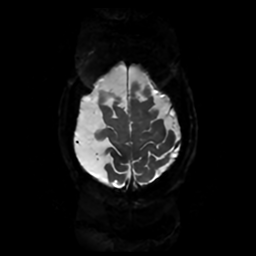
[im 85/93]
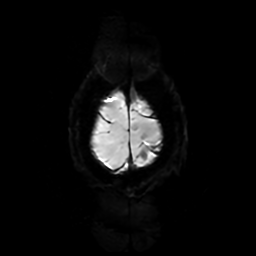
[im 93/93]
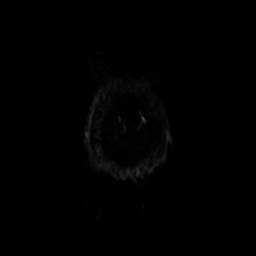

[Series 3: DWI · coronal · 4.0mm · 0.94mm/px · 10 of 70 slices shown (2 of 2)]
[im 1/70]
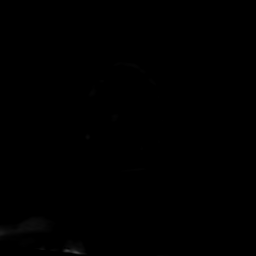
[im 8/70]
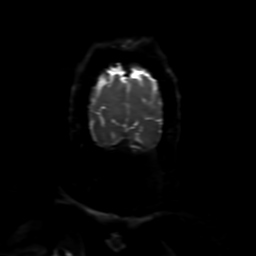
[im 16/70]
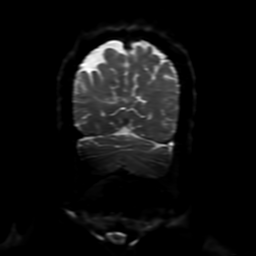
[im 24/70]
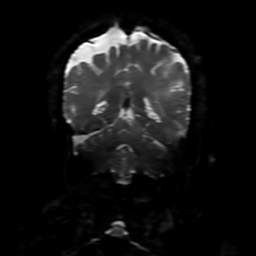
[im 31/70]
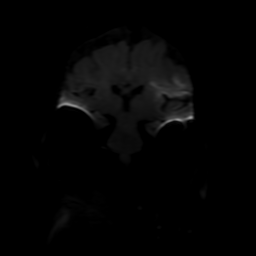
[im 39/70]
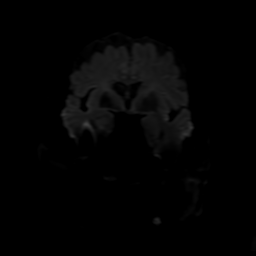
[im 47/70]
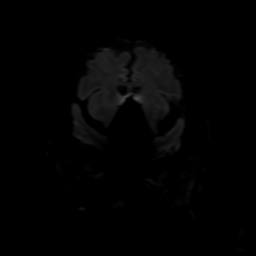
[im 54/70]
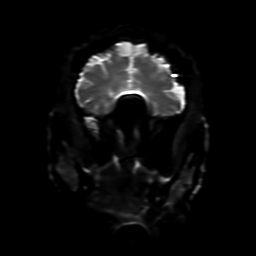
[im 62/70]
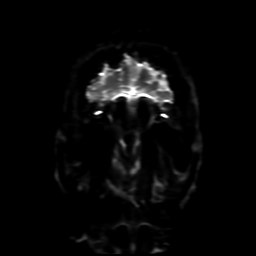
[im 70/70]
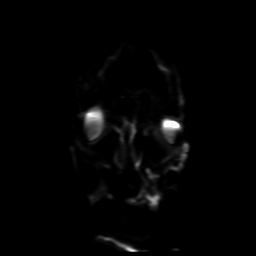

[Series 4: (person_name) · axial · 2.9mm · 0.47mm/px · z∈[-95,+43]mm · 8 of 94 slices shown]
[im 1/94]
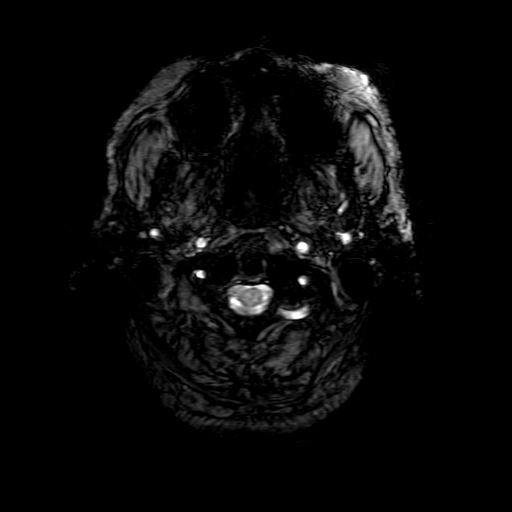
[im 16/94]
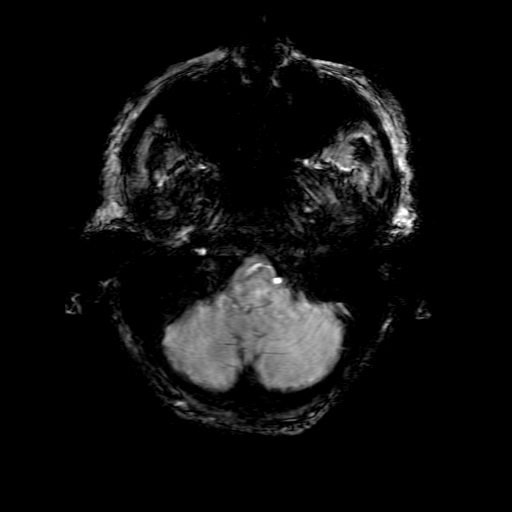
[im 32/94]
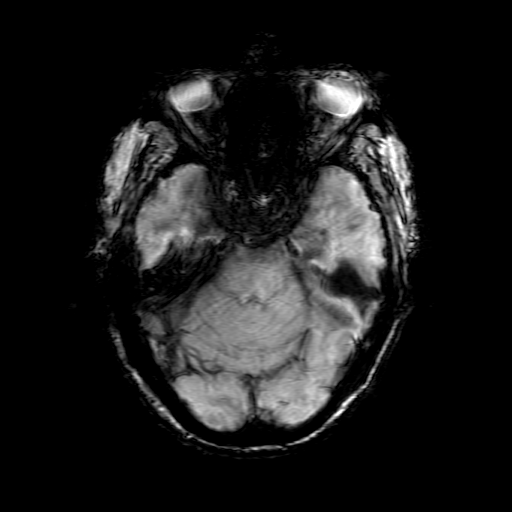
[im 39/94]
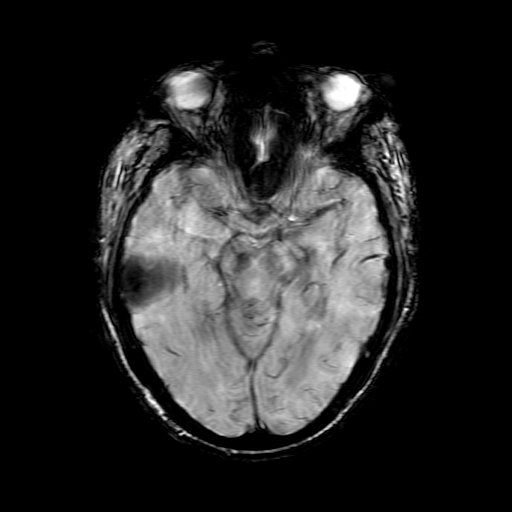
[im 55/94]
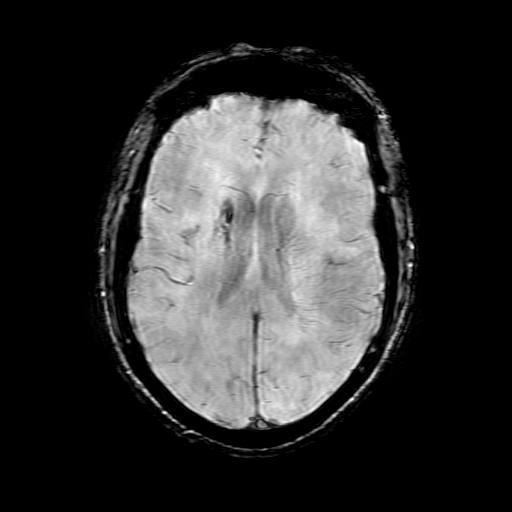
[im 63/94]
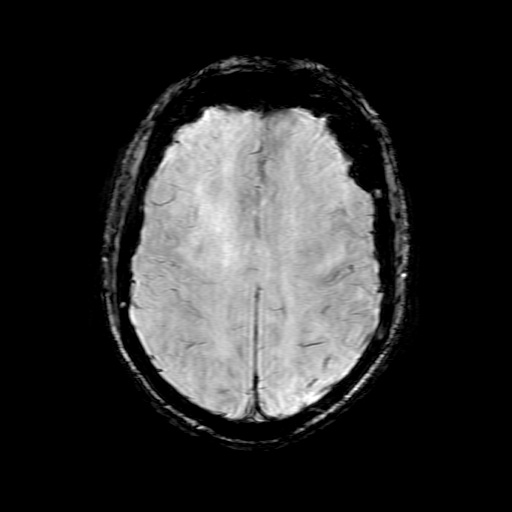
[im 78/94]
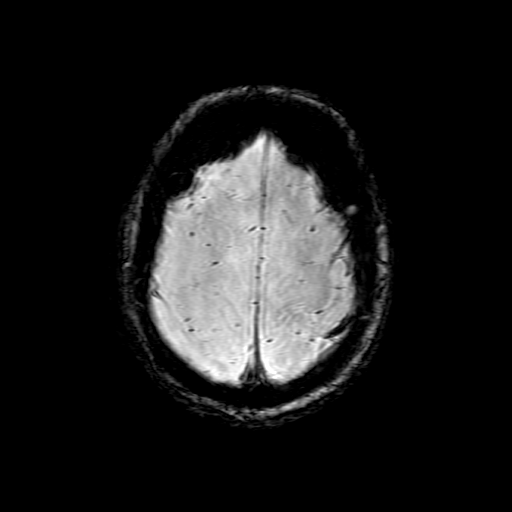
[im 94/94]
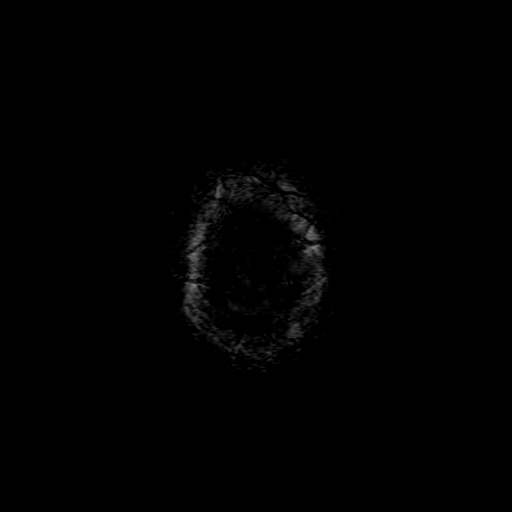

[Series 250: ADC · axial · 3.0mm · 0.94mm/px · z∈[-90,+49]mm · 7 of 48 slices shown (1 of 2)]
[im 1/48]
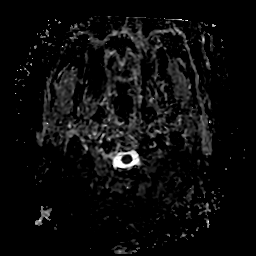
[im 8/48]
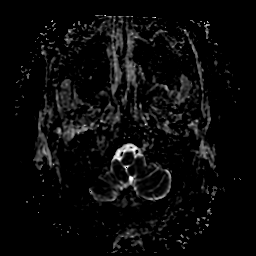
[im 16/48]
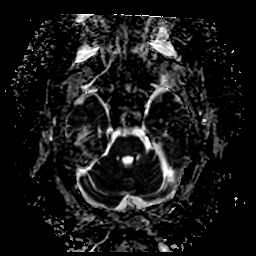
[im 24/48]
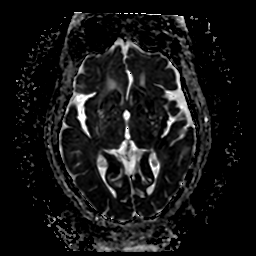
[im 32/48]
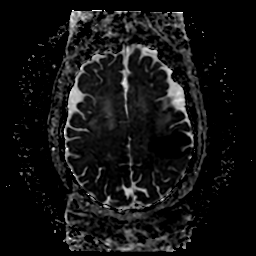
[im 40/48]
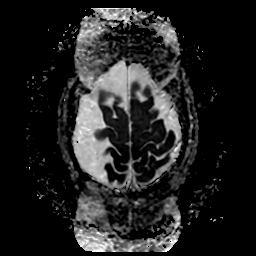
[im 48/48]
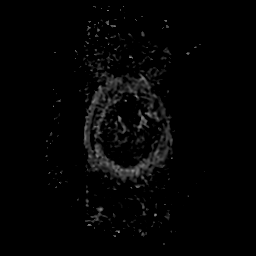

[Series 350: ADC · coronal · 4.0mm · 0.94mm/px · 5 of 35 slices shown (2 of 2)]
[im 1/35]
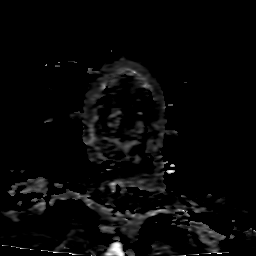
[im 9/35]
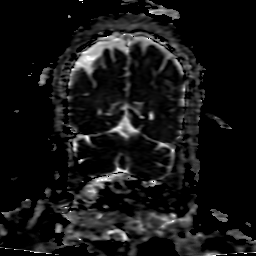
[im 18/35]
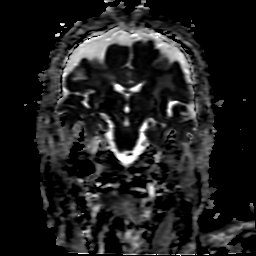
[im 26/35]
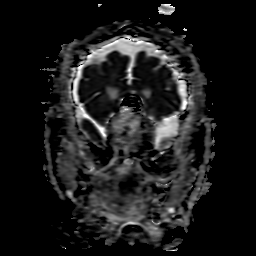
[im 35/35]
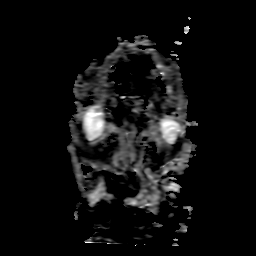

[43 of 48 positions shown; findings below may reference images not displayed]

FINDINGS: Sequences limited to diffusion-weighted imaging and susceptibility
weighted imaging.

Interval increase in the area of restricted diffusion in the
posterior left frontal lobe, now measuring approximately 2.7 x 3.5 x
3.8 cm (AP x TR x CC), previously scattered areas in this territory.
Restricted diffusion is also seen along the medial aspect of the
insula (series 2, image 24). No acute hemorrhage, hydrocephalus,
mass, mass effect, or midline shift.

Increased vascular prominence on susceptibility weighted imaging
(series 4, image 49), which may indicate luxury perfusion.
IMPRESSION: Interval increase in the area of acute infarct in the posterior left
frontal lobe, with additional areas of infarct seen in the left
insula, consistent with left MCA territory infarct.

These results were called by telephone at the time of interpretation
on [DATE] at [DATE] to provider Dr. NAIALA, Who verbally
acknowledged these results.

## 2021-05-30 MED ORDER — AMLODIPINE BESYLATE 5 MG PO TABS
5.0000 mg | ORAL_TABLET | Freq: Every day | ORAL | Status: DC
  Administered 2021-05-30 – 2021-06-02 (×4): 5 mg via ORAL
  Filled 2021-05-30 (×4): qty 1

## 2021-05-30 MED ORDER — CARVEDILOL 6.25 MG PO TABS
6.2500 mg | ORAL_TABLET | Freq: Two times a day (BID) | ORAL | Status: DC
  Administered 2021-05-30 – 2021-06-02 (×7): 6.25 mg via ORAL
  Filled 2021-05-30 (×8): qty 1

## 2021-05-30 NOTE — Evaluation (Signed)
Occupational Therapy Evaluation Patient Details Name: Joan Carr MRN: [id number] DOB: 1922-12-16 Today's Date: 05/30/2021   History of Present Illness Pt is 87 yo female who presented to ED on 05/29/21 with R facial droop, R UE weakness and numbness.  Pt found to have posterior L MCA/lateral and superior L parietal lobe infarcts.  Additionally , pt with occlusion of posterior L MCA M2 branch.  Pt with hx of DM2, HTN, prior CVA   Clinical Impression   Pt admitted for concerns listed above. PTA pt chart/PT eval reported that pt was independent with all ADL's and family assisted with IADL's. At this time, pt presenting with increased weakness on her R side, increased confusion, and Increased lethargy. She is requiring increased assist for all ADL's and was only able to attempt EOB this session due to needing +2 assist. Pt reliant on OT for sitting balance and unable to functionally move her RUE. Recommend SNF at this time to maximize progress with all ADL's and functional mobility. OT will follow acutely.      Recommendations for follow up therapy are one component of a multi-disciplinary discharge planning process, led by the attending physician.  Recommendations may be updated based on patient status, additional functional criteria and insurance authorization.   Follow Up Recommendations  SNF    Equipment Recommendations  Other (comment) (TBD)    Recommendations for Other Services       Precautions / Restrictions Precautions Precautions: Fall Restrictions Weight Bearing Restrictions: No      Mobility Bed Mobility Overal bed mobility: Needs Assistance Bed Mobility: Supine to Sit;Sit to Supine     Supine to sit: Max assist;HOB elevated Sit to supine: Max assist;HOB elevated   General bed mobility comments: Pt with difficulty following commands and did not engage her RUE at all to assist with sitting EOB. Required total A +2 to complete a supine scoot in bed.     Transfers                 General transfer comment: Deferred due to difficulty follow commands and balance/weakness concerns    Balance Overall balance assessment: Needs assistance Sitting-balance support: Single extremity supported;No upper extremity supported;Feet unsupported Sitting balance-Leahy Scale: Poor Sitting balance - Comments: Pt requiring mod-max A to remain upright at EOB Postural control: Posterior lean;Right lateral lean                                 ADL either performed or assessed with clinical judgement   ADL Overall ADL's : Needs assistance/impaired Eating/Feeding: Moderate assistance;Sitting Eating/Feeding Details (indicate cue type and reason): Un able to hold utensils with LUE Grooming: Moderate assistance;Sitting   Upper Body Bathing: Moderate assistance;Cueing for sequencing;Sitting   Lower Body Bathing: Maximal assistance;+2 for physical assistance;+2 for safety/equipment;Sitting/lateral leans;Sit to/from stand   Upper Body Dressing : Moderate assistance;Cueing for sequencing;Sitting   Lower Body Dressing: Maximal assistance;+2 for physical assistance;+2 for safety/equipment;Sitting/lateral leans;Sit to/from stand   Toilet Transfer: Maximal assistance;+2 for physical assistance;+2 for safety/equipment;Stand-pivot   Toileting- Clothing Manipulation and Hygiene: Maximal assistance;+2 for safety/equipment;+2 for physical assistance;Cueing for sequencing;Sit to/from stand;Sitting/lateral lean         General ADL Comments: Pt requiring increased assist this session due to difficulty follow commands and R sided weakness     Vision Baseline Vision/History: 1 Wears glasses Ability to See in Adequate Light: 0 Adequate Patient Visual Report: No change from baseline Vision  Assessment?: Vision impaired- to be further tested in functional context Additional Comments: Due to difficulty following commands, unable to test pt's vision this  session. OT observed that pt had difficulty turning to look at OT when standing on her R side.     Perception Perception Perception Tested?: No   Praxis Praxis Praxis tested?: Not tested    Pertinent Vitals/Pain Pain Assessment: No/denies pain     Hand Dominance Right   Extremity/Trunk Assessment Upper Extremity Assessment Upper Extremity Assessment: RUE deficits/detail;LUE deficits/detail RUE Deficits / Details: Pt unable to move her RUE at all this session, provided some residual resistance with PROM. RUE Coordination: decreased fine motor;decreased gross motor LUE Deficits / Details: ROM: some shoulder elevation deficits to ~90 degrees, otherwise WFL.  MMT: 3/5 shoulder, 4-/5 elbow, 4-/5 hand and wrist LUE Sensation: decreased light touch LUE Coordination: decreased fine motor   Lower Extremity Assessment Lower Extremity Assessment: Defer to PT evaluation   Cervical / Trunk Assessment Cervical / Trunk Assessment: Normal   Communication Communication Communication: Expressive difficulties (Pt shaking her head yes to all questions.)   Cognition Arousal/Alertness: Lethargic Behavior During Therapy: Flat affect Overall Cognitive Status: History of cognitive impairments - at baseline                                 General Comments: Pt responding yes to all questions by shaking her head, having difficulty following all commands, even simple ones. Oriented to self.   General Comments  VSS on 2L, pt was very dysarthric this session, only responding yes to all questions.    Exercises     Shoulder Instructions      Home Living Family/patient expects to be discharged to:: Unsure Living Arrangements: Children Available Help at Discharge: Family;Available PRN/intermittently Type of Home: House Home Access: Stairs to enter [employer] of Steps: 2 Entrance Stairs-Rails: None Home Layout: One level               Home Equipment: Walker - 2  wheels   Additional Comments: All home set up and PLOF obtained from PT eval, as pt was unable to participate in answering questions this session due to lethargy and confusion.      Prior Functioning/Environment Level of Independence: Needs assistance  Gait / Transfers Assistance Needed: Doesn't typically use RW or cane but fell last week and using RW since. Can normally ambulate short community distances ADL's / Homemaking Assistance Needed: Independent with ADLs; Pt may do some light IADLs but children mostly do IADLs   Comments: All info from PT eval as pt had difficulty answering any questions this session        OT Problem List: Decreased strength;Decreased range of motion;Decreased activity tolerance;Impaired balance (sitting and/or standing);Impaired vision/perception;Decreased coordination;Decreased cognition;Decreased safety awareness;Decreased knowledge of use of DME or AE;Impaired UE functional use      OT Treatment/Interventions: Self-care/ADL training;Therapeutic exercise;Energy conservation;DME and/or AE instruction;Therapeutic activities;Patient/family education;Balance training;Cognitive remediation/compensation;Visual/perceptual remediation/compensation    OT Goals(Current goals can be found in the care plan section) Acute Rehab OT Goals Patient Stated Goal: None Stated OT Goal Formulation: Patient unable to participate in goal setting Time For Goal Achievement: 06/13/21 Potential to Achieve Goals: Fair ADL Goals Pt Will Perform Eating: with set-up;sitting;with adaptive utensils Pt Will Perform Grooming: with set-up;with supervision;sitting Pt Will Perform Upper Body Dressing: with min assist;sitting Pt Will Perform Lower Body Dressing: with mod assist;sitting/lateral leans;sit to/from stand Pt Will Transfer  to Toilet: with mod assist;stand pivot transfer Additional ADL Goal #1: Pt will maintain balance EOB for 3 mins to prepare for seated ADL's.  OT Frequency: Min  2X/week   Barriers to D/C:            Co-evaluation              AM-PAC OT "6 Clicks" Daily Activity     Outcome Measure Help from another person eating meals?: A Lot Help from another person taking care of personal grooming?: A Lot Help from another person toileting, which includes using toliet, bedpan, or urinal?: A Lot Help from another person bathing (including washing, rinsing, drying)?: A Lot Help from another person to put on and taking off regular upper body clothing?: A Lot Help from another person to put on and taking off regular lower body clothing?: A Lot 6 Click Score: 12   End of Session Equipment Utilized During Treatment: Oxygen Nurse Communication: Mobility status  Activity Tolerance: Patient limited by lethargy Patient left: in bed;with call bell/phone within reach;with bed alarm set;with nursing/sitter in room  OT Visit Diagnosis: Other abnormalities of gait and mobility (R26.89);Muscle weakness (generalized) (M62.81);Hemiplegia and hemiparesis Hemiplegia - Right/Left: Right Hemiplegia - dominant/non-dominant: Dominant Hemiplegia - caused by: Cerebral infarction                Time: 2947-6546 OT Time Calculation (min): 21 min Charges:  OT General Charges $OT Visit: 1 Visit OT Evaluation $OT Eval Moderate Complexity: 1 Mod  Rice Walsh H., OTR/L Acute Rehabilitation  Ranson Belluomini Elane Anila Bojarski 05/30/2021, 12:18 PM

## 2021-05-30 NOTE — Progress Notes (Signed)
Physical Therapy Treatment Patient Details Name: Joan Carr MRN: [id number] DOB: October 16, 1922 Today's Date: 05/30/2021   History of Present Illness Pt is 87 yo female who presented to ED on 05/29/21 with R facial droop, R UE weakness and numbness.  Pt found to have posterior L MCA/lateral and superior L parietal lobe infarcts.  Additionally , pt with occlusion of posterior L MCA M2 branch.  Pt with hx of DM2, HTN, prior CVA    PT Comments    Patient needing increased assist for mobility compared to evaluation yesterday.  Was asleep initially but roused over time.  She was eager to stand, but R LE not supporting well needing heavy A for balance and unable to take steps.  She worked on cervical rotation with stiffness due to L gaze preference, but able to look to R with time and cues.  She will need STSNF at d/c unless family able to provide capable 24 hour assist.  PT to continue to follow.   Recommendations for follow up therapy are one component of a multi-disciplinary discharge planning process, led by the attending physician.  Recommendations may be updated based on patient status, additional functional criteria and insurance authorization.  Follow Up Recommendations  SNF     Equipment Recommendations  Wheelchair cushion (measurements PT);Wheelchair (measurements PT)    Recommendations for Other Services       Precautions / Restrictions Precautions Precautions: Fall Precaution Comments: R hemiplegia     Mobility  Bed Mobility Overal bed mobility: Needs Assistance Bed Mobility: Rolling;Sidelying to Sit Rolling: Max assist;Min assist Sidelying to sit: Max assist   Sit to supine: Max assist   General bed mobility comments: rolled for replacing bed pad and brief min A to roll to R using L UE, max A to roll L; side to sit assist for legs off bed and trunk upright; to supine assist for legs into bed and +2 to scoot to [facility]    Transfers Overall transfer level: Needs  assistance Equipment used: None (versus bed rail) Transfers: Sit to/from Stand Sit to Stand: Max assist         General transfer comment: lifting help and assist for balance and R LE stabilization in stance; moved R LE closer to L for balance with assist for L lateral weight shift to move R LE  Ambulation/Gait             General Gait Details: unable today without +2 A   Stairs             Wheelchair Mobility    Modified Rankin (Stroke Patients Only) Modified Rankin (Stroke Patients Only) Pre-Morbid Rankin Score: Slight disability Modified Rankin: Severe disability     Balance Overall balance assessment: Needs assistance Sitting-balance support: Feet supported Sitting balance-Leahy Scale: Poor Sitting balance - Comments: L UE support and close S with anterior and R lateral lean at times; sat EOB for great grandaughter to apply lotion to her back and for cervical AROM to encourage looking to R     Standing balance-Leahy Scale: Zero Standing balance comment: mod to max A for standing                            Cognition Arousal/Alertness: Lethargic Behavior During Therapy: Flat affect Overall Cognitive Status: Difficult to assess Area of Impairment: Attention;Following commands;Problem solving;Safety/judgement                   Current  Attention Level: Sustained   Following Commands: Follows one step commands consistently;Follows one step commands with increased time Safety/Judgement: Decreased awareness of deficits   Problem Solving: Slow processing;Decreased initiation;Requires tactile cues;Requires verbal cues General Comments: L gaze preference; difficutly assessing pt with expressive aphasia      Exercises      General Comments General comments (skin integrity, edema, etc.): great grandaughter and daughter in the room      Pertinent Vitals/Pain Pain Assessment: No/denies pain    Home Living                       Prior Function            PT Goals (current goals can now be found in the care plan section) Progress towards PT goals: Not progressing toward goals - comment    Frequency    Min 3X/week      PT Plan Current plan remains appropriate    Co-evaluation              AM-PAC PT "6 Clicks" Mobility   Outcome Measure  Help needed turning from your back to your side while in a flat bed without using bedrails?: Total Help needed moving from lying on your back to sitting on the side of a flat bed without using bedrails?: Total Help needed moving to and from a bed to a chair (including a wheelchair)?: Total Help needed standing up from a chair using your arms (e.g., wheelchair or bedside chair)?: Total Help needed to walk in hospital room?: Total Help needed climbing 3-5 steps with a railing? : Total 6 Click Score: 6    End of Session   Activity Tolerance: Patient limited by fatigue Patient left: in bed;with call bell/phone within reach;with bed alarm set   PT Visit Diagnosis: Unsteadiness on feet (R26.81);Muscle weakness (generalized) (M62.81);Hemiplegia and hemiparesis Hemiplegia - Right/Left: Right Hemiplegia - dominant/non-dominant: Dominant Hemiplegia - caused by: Cerebral infarction     Time: 4765-4650 PT Time Calculation (min) (ACUTE ONLY): 29 min  Charges:  $Therapeutic Activity: 23-37 mins                     Magda Kiel, PT Acute Rehabilitation Services PTWSF:[phone] Office:[phone] 05/30/2021    Joan Carr 05/30/2021, 5:13 PM

## 2021-05-30 NOTE — Progress Notes (Signed)
STROKE TEAM PROGRESS NOTE   INTERVAL HISTORY No family at bedside.  Patient lying in bed, neurologically worsened than yesterday, more dysarthria, right facial droop and right arm flaccid.  Repeat MRI showed extension of left frontal infarct.  She is on amlodipine and Coreg.  She is also on aspirin Plavix and statin.  Vitals:   05/30/21 0407 05/30/21 0739 05/30/21 1100 05/30/21 1500  BP: (!) 163/59 (!) 165/69 (!) 154/78 (!) 163/66  Pulse: 79 79 78 78  Resp: 20 15 16 20   Temp: 98 F (36.7 C) (!) 97.5 F (36.4 C) 97.6 F (36.4 C) 98.5 F (36.9 C)  TempSrc: Oral Oral Oral Oral  SpO2: 98% 99% 98% 99%  Weight:      Height:       CBC:  Recent Labs  Lab 05/29/21 0048 05/29/21 0054  WBC 8.5  --   NEUTROABS 6.4  --   HGB 13.1 13.3  HCT 40.1 39.0  MCV 105.0*  --   PLT 291  --    Basic Metabolic Panel:  Recent Labs  Lab 05/29/21 0048 05/29/21 0054 05/30/21 0342  NA 138 139 139  K 4.7 4.5 3.9  CL 107 109 107  CO2 22  --  24  GLUCOSE 151* 147* 117*  BUN 21 22 14   CREATININE 1.38* 1.30* 0.96  CALCIUM 8.8*  --  8.6*   Lipid Panel:  Recent Labs  Lab 05/29/21 0414  CHOL 214*  TRIG 78  HDL 46  CHOLHDL 4.7  VLDL 16  LDLCALC 152*   HgbA1c:  Recent Labs  Lab 05/29/21 0414  HGBA1C 6.1*   Urine Drug Screen: No results for input(s): LABOPIA, COCAINSCRNUR, LABBENZ, AMPHETMU, THCU, LABBARB in the last 168 hours.  Alcohol Level No results for input(s): ETH in the last 168 hours.  IMAGING past 24 hours No results found.  PHYSICAL EXAM  Temp:  [97.5 F (36.4 C)-98.5 F (36.9 C)] 98.5 F (36.9 C) (10/19 1500) Pulse Rate:  [74-87] 78 (10/19 1500) Resp:  [15-20] 20 (10/19 1500) BP: (102-178)/(55-84) 163/66 (10/19 1500) SpO2:  [91 %-99 %] 99 % (10/19 1500) Weight:  [77.1 kg] 77.1 kg (10/18 2142)  General - Well nourished, well developed, in no apparent distress.  Ophthalmologic - fundi not visualized due to noncooperation.  Cardiovascular - Regular rhythm and  rate.  Neuro - awake alert, not able to answer orientation questions. Expressive aphasia, and severe dysarthria, however follows most simple commands.  Not able to name but able to repeat 3 word sentences.  No gaze palsy, blinking to visual threat on the left, however seems not blinking to visual threat on the right.  Right facial droop more prominent than yesterday.  Left upper extremity at least 4/5, left lower extremity at least 3/5.  However, right upper extremity flaccid, left lower extremity 3 -/5.  Sensation not corporative.  Finger-to-nose grossly intact on the left.  ASSESSMENT/PLAN [name] is a 87 y.o. female with history of CHF, HTN, Stroke, DM HLD who presented with slurred speech, right facial droop and right side numbness. Family noticed this and administered 650mg  ASA.   Stroke extended: Acute small patchy left MCA infarcts extending to left frontal moderate infarct with left M2 severe stenosis/short segment occlusion, likely secondary to large vessel disease, although cardioembolic source can not be ruled out at this time.  Code stroke CTH No acute abnormality. ASPECTS 10.    MRI  head  Small, patchy acute infarcts in the posterior Left  MCA territory. No associated hemorrhage or mass effect. 2. Underlying advanced chronic small vessel disease. MRA  head/neck: 1. Arterial tortuosity in the neck with no hemodynamically significant stenosis other than a kinked appearance of the tortuous left ICA distal to the bulb. 2. Evidence of intracranial atherosclerosis, see Head MRA reported separately. 2D Echo EF 55 to 60% Consider 30 day cardiac event monitor as outpt LDL 152 HgbA1c 6.1 VTE prophylaxis - Lovenox and SCD's aspirin 325 mg daily prior to admission, now on aspirin 325 mg daily and clopidogrel 75 mg daily for 3 months given large vessel stenosis and then Plavix alone Therapy recommendations: SNF Disposition:  pending  Hypertension Home meds:  norvasc, coreg,  losartan Stable 150-170 now Avoid low BP or quick BP drop Gradually decrease BP to goal within 2 to 3 days Long-term BP goal 130-150 given left MCA severe stenosis  Hyperlipidemia Home meds:  none LDL 152, goal < 70 Add atorvastatin 40mg    Continue statin at discharge  Other Stroke Risk Factors Advanced Age >/= 28  Obesity, Body mass index is 32.12 kg/m., BMI >/= 30 associated with increased stroke risk, recommend weight loss, diet and exercise as appropriate  Hx stroke/TIA Coronary artery disease Congestive heart failure   Hospital day # 0  Patient condition worsened within the last 24 hours, has developed worsening right facial droop and right sided weakness, and MRI showed left frontal stroke extension. I discussed with Dr. Doristine Bosworth. I spent  35 minutes in total face-to-face time with the patient, more than 50% of which was spent in counseling and coordination of care, reviewing test results, images and medication, and discussing the diagnosis, treatment plan and potential prognosis. This patient's care requiresreview of multiple databases, neurological assessment, discussion with family, other specialists and medical decision making of high complexity.  [name], MD PhD Stroke Neurology 05/30/2021 6:34 PM    To contact Stroke Continuity provider, please refer to [email or web address] After hours, contact General Neurology

## 2021-05-30 NOTE — Progress Notes (Addendum)
PROGRESS NOTE    Joan Carr  OIN:[id number] DOB: 03-26-1923 DOA: 05/29/2021 PCP: Renaldo Reel, PA   Brief Narrative:  This is a 87 year old past medical history of prior stroke, hypertension and diabetes who was brought into the emergency department with facial droop and right upper extremity weakness as a code stroke and was subsequently diagnosed with acute Ischemic stroke in the posterior left MCA territory/lateral and superior left parietal lobe area.  MRA head and neck showed short segment occlusion of the posterior left MCA M2 branch at the bifurcation which correlates with area of stroke  Assessment & Plan:   Principal Problem:   Acute ischemic stroke [facility]) Active Problems:   Essential hypertension   Dyslipidemia   Diabetes ([place])  Acute ischemic stroke in posterior left MCA territory: MRI, MRI results as above.  Neurology saw this patient and recommend aspirin 325 mg along with Plavix, DAPT for 3 months and then Plavix alone.  Patient remains confused, not following any commands.  Slightly lethargic compared to yesterday and appears to have slightly worse dysarthria today.  Will notify neurology to see if they would like to repeat any scan again.  PT OT recommends SNF.  TOC on board.  AKI: Resolved.  Acute metabolic encephalopathy: Secondary to stroke.  Hyperlipidemia: Started on atorvastatin which we will continue.  Hypertension: Neurology recommends goal to keep systolic between 709-628.  Currently elevated.  Will resume home dose of Coreg and amlodipine.  Prediabetes: Monitor.  Dementia: Slightly worse due to stroke.  DVT prophylaxis: enoxaparin (LOVENOX) injection 30 mg Start: 05/29/21 1000   Code Status: DNR  Family Communication:  None present at bedside.  Plan of care discussed with daughter yesterday.  Status is: Inpatient  Remains inpatient appropriate because: Unsafe discharge/pending discharge to SNF.   Estimated body mass index is 32.12 kg/m as  calculated from the following:   Height as of this encounter: 5\' 1"  (1.549 m).   Weight as of this encounter: 77.1 kg.  Nutritional Assessment: Body mass index is 32.12 kg/m.Marland Kitchen Seen by dietician.  I agree with the assessment and plan as outlined below: Nutrition Status:  Skin Assessment: I have examined the patient's skin and I agree with the wound assessment as performed by the wound care RN as outlined below:    Consultants:  Neurology  Procedures:  None  Antimicrobials:  Anti-infectives (From admission, onward)    None          Subjective: Patient seen and examined.  She is slightly lethargic.  Has dysarthria.  Confused.  Unable to have a conversation.  Objective: Vitals:   05/30/21 0130 05/30/21 0207 05/30/21 0407 05/30/21 0739  BP: (!) 172/80 (!) 152/73 (!) 163/59 (!) 165/69  Pulse: 84 74 79 79  Resp: 18 15 20 15   Temp: 98.2 F (36.8 C) 98.5 F (36.9 C) 98 F (36.7 C) (!) 97.5 F (36.4 C)  TempSrc: Oral Oral Oral Oral  SpO2: 94% 93% 98% 99%  Weight:      Height:       No intake or output data in the 24 hours ending 05/30/21 1009 Filed Weights   05/29/21 0000 05/29/21 2142  Weight: 82.4 kg 77.1 kg    Examination:  General exam: Appears lethargic Respiratory system: Clear to auscultation. Respiratory effort normal. Cardiovascular system: S1 & S2 heard, RRR. No JVD, murmurs, rubs, gallops or clicks. No pedal edema. Gastrointestinal system: Abdomen is nondistended, soft and nontender. No organomegaly or masses felt. Normal bowel sounds heard.  Central nervous system: Lethargic and confused, dysarthric.  Unable to have conversation.  Not following commands appropriately.   Data Reviewed: I have personally reviewed following labs and imaging studies  CBC: Recent Labs  Lab 05/29/21 0048 05/29/21 0054  WBC 8.5  --   NEUTROABS 6.4  --   HGB 13.1 13.3  HCT 40.1 39.0  MCV 105.0*  --   PLT 291  --    Basic Metabolic Panel: Recent Labs  Lab  05/29/21 0048 05/29/21 0054 05/30/21 0342  NA 138 139 139  K 4.7 4.5 3.9  CL 107 109 107  CO2 22  --  24  GLUCOSE 151* 147* 117*  BUN 21 22 14   CREATININE 1.38* 1.30* 0.96  CALCIUM 8.8*  --  8.6*   GFR: Estimated Creatinine Clearance: 32.2 mL/min (by C-G formula based on SCr of 0.96 mg/dL). Liver Function Tests: Recent Labs  Lab 05/29/21 0048  AST 14*  ALT 11  ALKPHOS 49  BILITOT 0.6  PROT 6.6  ALBUMIN 3.4*   No results for input(s): LIPASE, AMYLASE in the last 168 hours. No results for input(s): AMMONIA in the last 168 hours. Coagulation Profile: Recent Labs  Lab 05/29/21 0048  INR 1.0   Cardiac Enzymes: No results for input(s): CKTOTAL, CKMB, CKMBINDEX, TROPONINI in the last 168 hours. BNP (last 3 results) No results for input(s): PROBNP in the last 8760 hours. HbA1C: Recent Labs    05/29/21 0414  HGBA1C 6.1*   CBG: Recent Labs  Lab 05/29/21 0047 05/29/21 0413  GLUCAP 143* 134*   Lipid Profile: Recent Labs    05/29/21 0414  CHOL 214*  HDL 46  LDLCALC 152*  TRIG 78  CHOLHDL 4.7   Thyroid Function Tests: Recent Labs    05/29/21 0414  TSH 0.948   Anemia Panel: No results for input(s): VITAMINB12, FOLATE, FERRITIN, TIBC, IRON, RETICCTPCT in the last 72 hours. Sepsis Labs: No results for input(s): PROCALCITON, LATICACIDVEN in the last 168 hours.  Recent Results (from the past 240 hour(s))  Resp Panel by RT-PCR (Flu A&B, Covid) Nasopharyngeal Swab     Status: None   Collection Time: 05/29/21  2:02 AM   Specimen: Nasopharyngeal Swab; Nasopharyngeal(NP) swabs in vial transport medium  Result Value Ref Range Status   SARS Coronavirus 2 by RT PCR NEGATIVE NEGATIVE Final    Comment: (NOTE) SARS-CoV-2 target nucleic acids are NOT DETECTED.  The SARS-CoV-2 RNA is generally detectable in upper respiratory specimens during the acute phase of infection. The lowest concentration of SARS-CoV-2 viral copies this assay can detect is 138 copies/mL. A  negative result does not preclude SARS-Cov-2 infection and should not be used as the sole basis for treatment or other patient management decisions. A negative result may occur with  improper specimen collection/handling, submission of specimen other than nasopharyngeal swab, presence of viral mutation(s) within the areas targeted by this assay, and inadequate number of viral copies(<138 copies/mL). A negative result must be combined with clinical observations, patient history, and epidemiological information. The expected result is Negative.  Fact Sheet for Patients:  EntrepreneurPulse.com.au  Fact Sheet for Healthcare Providers:  IncredibleEmployment.be  This test is no t yet approved or cleared by the [place] FDA and  has been authorized for detection and/or diagnosis of SARS-CoV-2 by FDA under an Emergency Use Authorization (EUA). This EUA will remain  in effect (meaning this test can be used) for the duration of the COVID-19 declaration under Section 564(b)(1) of the Act, 21 U.S.C.section 360bbb-3(b)(1),  unless the authorization is terminated  or revoked sooner.       Influenza A by PCR NEGATIVE NEGATIVE Final   Influenza B by PCR NEGATIVE NEGATIVE Final    Comment: (NOTE) The Xpert Xpress SARS-CoV-2/FLU/RSV plus assay is intended as an aid in the diagnosis of influenza from Nasopharyngeal swab specimens and should not be used as a sole basis for treatment. Nasal washings and aspirates are unacceptable for Xpert Xpress SARS-CoV-2/FLU/RSV testing.  Fact Sheet for Patients: EntrepreneurPulse.com.au  Fact Sheet for Healthcare Providers: IncredibleEmployment.be  This test is not yet approved or cleared by the [place] FDA and has been authorized for detection and/or diagnosis of SARS-CoV-2 by FDA under an Emergency Use Authorization (EUA). This EUA will remain in effect (meaning this test can  be used) for the duration of the COVID-19 declaration under Section 564(b)(1) of the Act, 21 U.S.C. section 360bbb-3(b)(1), unless the authorization is terminated or revoked.  Performed at [facility], [place] [address] [place], [place] [address]       Radiology Studies: MR ANGIO HEAD WO CONTRAST  Result Date: 05/29/2021 CLINICAL DATA:  87 year old female code stroke. Right side numbness and facial droop. Patchy acute infarcts in the posterior left MCA territory. EXAM: MRA HEAD WITHOUT CONTRAST TECHNIQUE: Angiographic images of the Circle of Willis were acquired using MRA technique without intravenous contrast. COMPARISON:  Brain MRI and neck MRA today. FINDINGS: Antegrade flow in the posterior circulation. Codominant distal vertebral arteries and vertebrobasilar junction appear patent without stenosis. Normal right PICA origin. Patent basilar artery without stenosis. Patent SCA and PCA origins. Right PCA branches are patent with only mild irregularity. There is moderate irregularity and stenosis at the junction of the left P1 and P2 segments on series 1013, image 12). Otherwise mild left PCA branch irregularity. Antegrade flow in both ICA siphons. Evidence of bilateral siphon atherosclerosis. But no hemodynamically significant siphon stenosis is evident on these images. Patent carotid termini, MCA and ACA origins. Dominant left A1. Anterior communicating artery is within normal limits. ACA A2 segments are tortuous, with mild to moderate left A2 stenosis on series 1007, image 6, but preserved distal flow. Right MCA M1 segment and MCA bifurcation appear patent without stenosis. Visible right MCA branches are within normal limits. Left MCA M1 segment is patent. But there is irregularity at the left carotid bifurcation (series 1007, image 8) and evidence of severe stenosis or short segment occlusion of a posterior left M2 branch (series 1003, image 4 and series 5, image 100). Code stroke. Right side  numbness and facial droop. There is also a moderate stenosis of the left anterior temporal artery (series 1003, image 13). Other visible left MCA branches appear to be patent. IMPRESSION: 1. Positive for severe stenosis or short segment occlusion of a posterior Left MCA M2 branch at the bifurcation, concordant with the patchy acute ischemia by MRI. Additional mild to moderate stenosis at the left MCA bifurcation. 2. Other intracranial atherosclerosis including up to Moderate stenoses of the : - left ACA A2, - left Anterior Temporal Artery, - left PCA P1/P2 junction These results were communicated to Dr. Theda Sers Neurology at 5:20 am on 05/29/2021 by text page via the [facility] messaging system. Electronically Signed   By: Genevie Ann M.D.   On: 05/29/2021 05:20   MR MRA NECK W CONTRAST  Result Date: 05/29/2021 CLINICAL DATA:  87 year old female code stroke. Right side numbness and facial droop. EXAM: MRA NECK WITHOUT AND WITH CONTRAST TECHNIQUE: Angiographic images of  the neck were acquired using MRA technique without and with intravenous contrast. Carotid stenosis measurements (when applicable) are obtained utilizing NASCET criteria, using the distal internal carotid diameter as the denominator. CONTRAST:  48mL GADAVIST GADOBUTROL 1 MMOL/ML IV SOLN COMPARISON:  Brain MRI today. FINDINGS: Precontrast time-of-flight images demonstrate antegrade flow in the bilateral cervical carotid and vertebral arteries. Mild vessel tortuosity. Retropharyngeal course of the left carotid and bifurcation. Time-of-flight images suggest no hemodynamically significant stenosis. Postcontrast images demonstrate a 3 vessel arch configuration. Proximal great vessels are within normal limits. Mildly tortuous right CCA without stenosis. Patent right carotid bifurcation with only minor irregularity at the right ICA origin and bulb. Tortuous cervical right ICA without stenosis. Tortuous left CCA without stenosis. Patent left carotid bifurcation.  Irregularity is mostly at the ECA origin. No proximal left ICA stenosis. Tortuous left ICA distal to the bulb with a kinked appearance (series 1015, image 5), but no other cervical left ICA stenosis. Proximal subclavian arteries and vertebral artery origins are patent. Mild bilateral vertebral origin stenosis suspected. Both vertebral arteries are patent to the skull base without additional stenosis. Visible bilateral ICA siphons appear patent with irregularity greater on the left and maximal at the left supraclinoid segment suggesting mild-to-moderate stenosis there (series 1027, image 1). Patent carotid termini. Patent basilar artery. Moderate stenosis of the left PCA P1/P2 also suspected on series 1037, image 12. See intracranial MRA reported separately. IMPRESSION: 1. Arterial tortuosity in the neck with no hemodynamically significant stenosis other than a kinked appearance of the tortuous left ICA distal to the bulb. 2. Evidence of intracranial atherosclerosis, see Head MRA reported separately. Electronically Signed   By: Genevie Ann M.D.   On: 05/29/2021 05:12   MR BRAIN WO CONTRAST  Result Date: 05/29/2021 CLINICAL DATA:  87 year old female code stroke. Right side numbness and facial droop. EXAM: MRI HEAD WITHOUT CONTRAST TECHNIQUE: Multiplanar, multiecho pulse sequences of the brain and surrounding structures were obtained without intravenous contrast. COMPARISON:  Head CT 0054 hours today, and earlier. FINDINGS: Brain: Small discontinuous areas of cortical restricted diffusion in the posterior left MCA territory affecting the lateral and superior left parietal lobe. Patchy regional white matter involvement also. Minimal associated cytotoxic edema. No hemorrhage or mass effect. No other restricted diffusion identified. Small chronic infarcts in the bilateral cerebellum, bilateral deep gray matter nuclei and corona radiata. Patchy and confluent bilateral white matter T2 and FLAIR hyperintensity. Patchy T2  heterogeneity in the pons. No chronic cortical encephalomalacia or definite chronic cerebral blood products. No midline shift, mass effect, evidence of mass lesion, ventriculomegaly, extra-axial collection or acute intracranial hemorrhage. Cervicomedullary junction and pituitary are within normal limits. Vascular: Major intracranial vascular flow voids are preserved. See also MRA reported separately. Skull and upper cervical spine: Negative for age visible cervical spine. Hyperostosis of the calvarium (normal variant). Normal bone marrow signal. Sinuses/Orbits: Negative. Other: Visible internal auditory structures appear normal. Mastoids are well aerated. Negative visible scalp and face. IMPRESSION: 1. Small, patchy acute infarcts in the posterior Left MCA territory. No associated hemorrhage or mass effect. 2. Underlying advanced chronic small vessel disease. Electronically Signed   By: Genevie Ann M.D.   On: 05/29/2021 05:07   DG Chest Port 1 View  Result Date: 05/29/2021 CLINICAL DATA:  Slurred speech/stroke EXAM: PORTABLE CHEST 1 VIEW COMPARISON:  CT chest dated 10/05/2020 FINDINGS: Prominent epicardial fat at the left lung base. Right lung is clear. No pleural effusion or pneumothorax. The heart is top-normal in size.  Thoracic aortic  atherosclerosis. IMPRESSION: No evidence of acute cardiopulmonary disease. Electronically Signed   By: Julian Hy M.D.   On: 05/29/2021 02:27   ECHOCARDIOGRAM COMPLETE  Result Date: 05/29/2021    ECHOCARDIOGRAM REPORT   Patient Name:   SOUMYA COLSON [facility] Date of Exam: 05/29/2021 Medical Rec #:  [id number]         Height:       63.0 in Accession #:    [id number]        Weight:       181.7 lb Date of Birth:  06-20-1923         BSA:          1.856 m Patient Age:    87 years          BP:           159/97 mmHg Patient Gender: F                 HR:           84 bpm. Exam Location:  Inpatient Procedure: 2D Echo, Cardiac Doppler and Color Doppler Indications:   CVA  History:        Patient has no prior history of Echocardiogram examinations.                Stroke.  Sonographer:   NA Referring      [place] Phys: IMPRESSIONS  1. Left ventricular ejection fraction, by estimation, is 55 to 60%. The left ventricle has normal function. The left ventricle has no regional wall motion abnormalities. Left ventricular diastolic parameters are consistent with Grade I diastolic dysfunction (impaired relaxation).  2. Right ventricular systolic function is normal. The right ventricular size is normal. Tricuspid regurgitation signal is inadequate for assessing PA pressure.  3. The mitral valve is normal in structure. No evidence of mitral valve regurgitation. No evidence of mitral stenosis. Moderate mitral annular calcification.  4. The aortic valve is tricuspid. Aortic valve regurgitation is trivial. Mild aortic valve sclerosis is present, with no evidence of aortic valve stenosis.  5. The inferior vena cava is normal in size with greater than 50% respiratory variability, suggesting right atrial pressure of 3 mmHg. FINDINGS  Left Ventricle: Left ventricular ejection fraction, by estimation, is 55 to 60%. The left ventricle has normal function. The left ventricle has no regional wall motion abnormalities. The left ventricular internal cavity size was normal in size. There is  borderline left ventricular hypertrophy. Left ventricular diastolic parameters are consistent with Grade I diastolic dysfunction (impaired relaxation). Right Ventricle: The right ventricular size is normal. No increase in right ventricular wall thickness. Right ventricular systolic function is normal. Tricuspid regurgitation signal is inadequate for assessing PA pressure. Left Atrium: Left atrial size was normal in size. Right Atrium: Right atrial size was normal in size. Pericardium: Trivial pericardial effusion is present. Mitral Valve: The mitral valve is normal in structure. Moderate mitral annular calcification. No  evidence of mitral valve regurgitation. No evidence of mitral valve stenosis. Tricuspid Valve: The tricuspid valve is normal in structure. Tricuspid valve regurgitation is not demonstrated. Aortic Valve: The aortic valve is tricuspid. Aortic valve regurgitation is trivial. Mild aortic valve sclerosis is present, with no evidence of aortic valve stenosis. Pulmonic Valve: The pulmonic valve was normal in structure. Pulmonic valve regurgitation is trivial. Aorta: The aortic root is normal in size and structure. Venous: The inferior vena cava is normal in size with greater than 50% respiratory variability, suggesting right atrial  pressure of 3 mmHg. IAS/Shunts: No atrial level shunt detected by color flow Doppler.  LEFT VENTRICLE PLAX 2D LVIDd:         3.90 cm   Diastology LVIDs:         2.80 cm   LV e' medial:    5.33 cm/s LV PW:         1.00 cm   LV E/e' medial:  11.6 LV IVS:        1.10 cm   LV e' lateral:   5.87 cm/s LVOT diam:     1.70 cm   LV E/e' lateral: 10.6 LV SV:         33 LV SV Index:   18 LVOT Area:     2.27 cm  RIGHT VENTRICLE            IVC RV S prime:     9.68 cm/s  IVC diam: 1.10 cm TAPSE (M-mode): 1.0 cm LEFT ATRIUM             Index        RIGHT ATRIUM           Index LA diam:        3.20 cm 1.72 cm/m   RA Area:     12.20 cm LA Vol (A2C):   37.4 ml 20.15 ml/m  RA Volume:   29.60 ml  15.95 ml/m LA Vol (A4C):   26.5 ml 14.28 ml/m LA Biplane Vol: 32.5 ml 17.51 ml/m  AORTIC VALVE LVOT Vmax:   75.70 cm/s LVOT Vmean:  49.300 cm/s LVOT VTI:    0.147 m  AORTA Ao Root diam: 2.50 cm Ao Asc diam:  2.90 cm MV E velocity: 62.00 cm/s MV A velocity: 149.00 cm/s  SHUNTS MV E/A ratio:  0.42         Systemic VTI:  0.15 m                             Systemic Diam: 1.70 cm Dalton McleanMD Electronically signed by Franki Monte Signature Date/Time: 05/29/2021/3:32:38 PM    Final    CT HEAD CODE STROKE WO CONTRAST  Result Date: 05/29/2021 CLINICAL DATA:  Code stroke.  Right-sided numbness and facial droop  EXAM: CT HEAD WITHOUT CONTRAST TECHNIQUE: Contiguous axial images were obtained from the base of the skull through the vertex without intravenous contrast. COMPARISON:  03/22/2009 FINDINGS: Brain: No acute cortical infarction. No acute hemorrhage, hydrocephalus, extra-axial collection, mass, mass effect, or midline shift degree of cerebral atrophy is not unexpected for age. Periventricular white matter changes, likely the sequela of chronic small vessel ischemic disease. Right basal ganglia lacunar infarct. Vascular: No hyperdense vessel or unexpected calcification. Skull: Normal. Negative for fracture or focal lesion. Sinuses/Orbits: No acute finding. Status post bilateral lens replacements. Other: The mastoids are well aerated. ASPECTS [facility] Stroke Program Early CT Score) - Ganglionic level infarction (caudate, lentiform nuclei, internal capsule, insula, M1-M3 cortex): 7 - Supraganglionic infarction (M4-M6 cortex): 3 Total score (0-10 with 10 being normal): 10 IMPRESSION: 1. No acute intracranial process. 2. ASPECTS is 10 Code stroke imaging results were communicated on 05/29/2021 at 1:09 am to provider [facility] via secure text paging. Electronically Signed   By: Merilyn Baba M.D.   On: 05/29/2021 01:09    Scheduled Meds:   stroke: mapping our early stages of recovery book   Does not apply Once   amLODipine  5 mg Oral Daily  aspirin EC  325 mg Oral Daily   atorvastatin  40 mg Oral Daily   carvedilol  6.25 mg Oral BID WC   clopidogrel  75 mg Oral Daily   enoxaparin (LOVENOX) injection  30 mg Subcutaneous Q24H   fluticasone furoate-vilanterol  1 puff Inhalation Daily   traZODone  100 mg Oral QHS   umeclidinium bromide  1 puff Inhalation Daily   Continuous Infusions:   LOS: 0 days   Time spent: 30 minutes   Darliss Cheney, MD [facility]  05/30/2021, 10:09 AM  Please page via [facility] and do not message via secure chat for anything urgent. Secure chat can be used for anything non  urgent.  How to contact the [facility] Attending or Consulting provider [place] or covering provider during after hours [place], for this patient?  Check the care team in [facility] and look for a) attending/consulting [facility] provider listed and b) the [facility] team listed. Page or secure chat [phone]. Log into [email or web address] and use [place]'s universal password to access. If you do not have the password, please contact the hospital operator. Locate the [facility] provider you are looking for under [facility] and page to a number that you can be directly reached. If you still have difficulty reaching the provider, please page the [facility] (Director on Call) for the Hospitalists listed on amion for assistance.

## 2021-05-30 NOTE — NC FL2 (Signed)
[place] LEVEL OF CARE SCREENING TOOL     IDENTIFICATION  Patient Name: Joan Carr Birthdate: 04-13-1923 Sex: female Admission Date (Current Location): 05/29/2021  [facility] and [place] Number:  [employer] and Address:  [facility] [place], [place] [address] [place], [place] [address]      Provider Number: [id number]  Attending Physician Name and Address:  Darliss Cheney, MD  Relative Name and Phone Number:       Current Level of Care: Hospital Recommended Level of Care: [place] Prior Approval Number:    Date Approved/Denied:   PASRR Number:    Discharge Plan: SNF    Current Diagnoses: Patient Active Problem List   Diagnosis Date Noted   Acute ischemic stroke ([place]) 05/29/2021   Stroke (cerebrum) ([place])    Hypertension    Diabetes ([place])    Essential hypertension 07/25/2020   Atypical chest pain 07/25/2020   Dyslipidemia 07/25/2020   CHF (congestive heart failure) ([place])     Orientation RESPIRATION BLADDER Height & Weight        O2 (see d/c summary for oxygen needs) Incontinent Weight: 77.1 kg Height:  5\' 1"  (154.9 cm)  BEHAVIORAL SYMPTOMS/MOOD NEUROLOGICAL BOWEL NUTRITION STATUS      Continent Diet (heart healthy with thin liquids)  AMBULATORY STATUS COMMUNICATION OF NEEDS Skin   Extensive Assist Verbally Normal                       Personal Care Assistance Level of Assistance  Bathing, Feeding, Dressing Bathing Assistance: Maximum assistance Feeding assistance: Limited assistance Dressing Assistance: Maximum assistance     Functional Limitations Info  Sight, Hearing, Speech Sight Info: Impaired Hearing Info: Adequate Speech Info: Impaired (slur/ dysarthria)    SPECIAL CARE FACTORS FREQUENCY  PT (By licensed PT), OT (By licensed OT), Speech therapy     PT Frequency: 5x/wk OT Frequency: 5x/wk     Speech Therapy Frequency: 5x/wk      Contractures Contractures Info: Not present     Additional Factors Info  Code Status, Allergies, Psychotropic Code Status Info: DNR Allergies Info: Penicillin Psychotropic Info: Trazadone 100 mg at bedtime         Current Medications (05/30/2021):  This is the current hospital active medication list Current Facility-Administered Medications  Medication Dose Route Frequency Provider Last Rate Last Admin    stroke: mapping our early stages of recovery book   Does not apply Once Alcario Drought, Jared M, DO       acetaminophen (TYLENOL) tablet 650 mg  650 mg Oral Q4H PRN Etta Quill, DO       Or   acetaminophen (TYLENOL) 160 MG/5ML solution 650 mg  650 mg Per Tube Q4H PRN Etta Quill, DO       Or   acetaminophen (TYLENOL) suppository 650 mg  650 mg Rectal Q4H PRN Etta Quill, DO       amLODipine (NORVASC) tablet 5 mg  5 mg Oral Daily Pahwani, Einar Grad, MD   5 mg at 05/30/21 1020   aspirin EC tablet 325 mg  325 mg Oral Daily [name], MD   325 mg at 05/30/21 1019   atorvastatin (LIPITOR) tablet 40 mg  40 mg Oral Daily [name], MD   40 mg at 05/30/21 1019   carvedilol (COREG) tablet 6.25 mg  6.25 mg Oral BID WC Darliss Cheney, MD   6.25 mg at 05/30/21 0858   clopidogrel (PLAVIX)  tablet 75 mg  75 mg Oral Daily Jennette Kettle M, DO   75 mg at 05/30/21 1019   enoxaparin (LOVENOX) injection 30 mg  30 mg Subcutaneous Q24H Alcario Drought, Jared M, DO   30 mg at 05/30/21 1019   fluticasone furoate-vilanterol (BREO ELLIPTA) 100-25 MCG/INH 1 puff  1 puff Inhalation Daily Darliss Cheney, MD   1 puff at 05/30/21 0738   labetalol (NORMODYNE) injection 5-10 mg  5-10 mg Intravenous Q2H PRN Etta Quill, DO   5 mg at 05/29/21 0957   meclizine (ANTIVERT) tablet 12.5 mg  12.5 mg Oral Q8H PRN Etta Quill, DO       traZODone (DESYREL) tablet 100 mg  100 mg Oral QHS Jennette Kettle M, DO   100 mg at 05/29/21 2146   umeclidinium bromide (INCRUSE ELLIPTA) 62.5 MCG/INH 1 puff  1 puff Inhalation Daily Darliss Cheney, MD   1 puff at 05/30/21 9198      Discharge Medications: Please see discharge summary for a list of discharge medications.  Relevant Imaging Results:  Relevant Lab Results:   Additional Information SS#: [id number]  Pollie Friar, RN

## 2021-05-30 NOTE — TOC Initial Note (Addendum)
Transition of Care [facility]) - Initial/Assessment Note    Patient Details  Name: Joan Carr MRN: [id number] Date of Birth: Dec 24, 1922  Transition of [facility]) CM/SW Contact:    Pollie Friar, RN Phone Number: 05/30/2021, 11:58 AM  Clinical Narrative:                 Pt is from home. CM met with the patient and her daughter, Margaretha Sheffield at the bedside. Patient confused so did not participate in the conversation.  Daughter is interested in SNF rehab after the hospital. They live in [place] so prefer a rehab in that area. CM has completed FL2 and faxed pt out in the [facility] area. Daughter provided list of SNF in the area from [email or web address] Will need insurance auth once daughter has decided on facility. Currently awaiting bed offers.  Pt has received 2 covid vaccines and 1 booster (Moderna). TOC following.  1515: family provided SNF offers for rehab. Will follow up in am with patients daughter.   Expected Discharge Plan: Skilled Nursing Facility Barriers to Discharge: Continued Medical Work up   Patient Goals and CMS Choice   CMS Medicare.gov Compare Post Acute Care list provided to:: Patient Represenative (must comment) Choice offered to / list presented to : Adult Children  Expected Discharge Plan and Services Expected Discharge Plan: [place] In-house Referral: Clinical Social Work Discharge Planning Services: [place] Acute Care Choice: [place] arrangements for the past 2 months: [place]                                      Prior Living Arrangements/Services Living arrangements for the past 2 months: Single Family Home Lives with:: Self Patient language and need for interpreter reviewed:: Yes Do you feel safe going back to the place where you live?: Yes      Need for Family Participation in Patient Care: Yes (Comment)     Criminal Activity/Legal Involvement Pertinent to Current  Situation/Hospitalization: No - Comment as needed  Activities of Daily Living      Permission Sought/Granted                  Emotional Assessment Appearance:: Appears stated age         Psych Involvement: No (comment)  Admission diagnosis:  Stroke [facility]) [I63.9] Acute ischemic stroke [facility]) [I63.9] Patient Active Problem List   Diagnosis Date Noted   Acute ischemic stroke ([place]) 05/29/2021   Stroke (cerebrum) ([place])    Hypertension    Diabetes ([place])    Essential hypertension 07/25/2020   Atypical chest pain 07/25/2020   Dyslipidemia 07/25/2020   CHF (congestive heart failure) ([place])    PCP:  Renaldo Reel, PA Pharmacy:   [facility], [place] [address] Phone: [phone] Fax: [phone]     Social Determinants of Health (SDOH) Interventions    Readmission Risk Interventions No flowsheet data found.

## 2021-05-30 NOTE — Progress Notes (Signed)
Unable to do pt's admission history because patient is an poor historian because the pt has a Hx of dementia and currently confused and is unable to answer questions.

## 2021-05-30 NOTE — Telephone Encounter (Signed)
Wanted to make you aware that this patient is in the hospital, she had a stroke.

## 2021-05-30 NOTE — Plan of Care (Signed)
  Problem: Clinical Measurements: Goal: Ability to maintain clinical measurements within normal limits will improve Outcome: Progressing Goal: Will remain free from infection Outcome: Progressing Goal: Diagnostic test results will improve Outcome: Progressing Goal: Respiratory complications will improve Outcome: Progressing Goal: Cardiovascular complication will be avoided Outcome: Progressing   Problem: Elimination: Goal: Will not experience complications related to bowel motility Outcome: Progressing Goal: Will not experience complications related to urinary retention Outcome: Progressing   Problem: Safety: Goal: Ability to remain free from injury will improve Outcome: Progressing   Problem: Skin Integrity: Goal: Risk for impaired skin integrity will decrease Outcome: Progressing

## 2021-05-31 ENCOUNTER — Ambulatory Visit: Payer: Medicare Other | Admitting: Cardiology

## 2021-05-31 NOTE — Progress Notes (Signed)
STROKE TEAM PROGRESS NOTE   INTERVAL HISTORY Daughter and granddaughter are at the bedside.  Patient lying in bed.  Her right arm and leg weakness much improved, still has right facial droop but slurred speech also improved.  PT/OT recommended SNF.  Vitals:   05/31/21 0700 05/31/21 1100 05/31/21 1500 05/31/21 1655  BP: (!) 126/49 (!) 120/47 (!) 135/54 (!) 143/60  Pulse: 77 67 66 72  Resp: 17 16 16 18   Temp: 98 F (36.7 C) 97.6 F (36.4 C) 97.7 F (36.5 C)   TempSrc: Oral Oral Oral   SpO2: 98% 95% 99% 93%  Weight:      Height:       CBC:  Recent Labs  Lab 05/29/21 0048 05/29/21 0054  WBC 8.5  --   NEUTROABS 6.4  --   HGB 13.1 13.3  HCT 40.1 39.0  MCV 105.0*  --   PLT 291  --    Basic Metabolic Panel:  Recent Labs  Lab 05/29/21 0048 05/29/21 0054 05/30/21 0342  NA 138 139 139  K 4.7 4.5 3.9  CL 107 109 107  CO2 22  --  24  GLUCOSE 151* 147* 117*  BUN 21 22 14   CREATININE 1.38* 1.30* 0.96  CALCIUM 8.8*  --  8.6*   Lipid Panel:  Recent Labs  Lab 05/29/21 0414  CHOL 214*  TRIG 78  HDL 46  CHOLHDL 4.7  VLDL 16  LDLCALC 152*   HgbA1c:  Recent Labs  Lab 05/29/21 0414  HGBA1C 6.1*   Urine Drug Screen: No results for input(s): LABOPIA, COCAINSCRNUR, LABBENZ, AMPHETMU, THCU, LABBARB in the last 168 hours.  Alcohol Level No results for input(s): ETH in the last 168 hours.  IMAGING past 24 hours No results found.  PHYSICAL EXAM  Temp:  [97.6 F (36.4 C)-98.2 F (36.8 C)] 97.7 F (36.5 C) (10/20 1500) Pulse Rate:  [66-79] 72 (10/20 1655) Resp:  [16-18] 18 (10/20 1655) BP: (120-169)/(47-61) 143/60 (10/20 1655) SpO2:  [93 %-100 %] 93 % (10/20 1655)  General - Well nourished, well developed, in no apparent distress.  Ophthalmologic - fundi not visualized due to noncooperation.  Cardiovascular - Regular rhythm and rate.  Neuro - awake alert, able to tell me her name, not orientated to age, time but orientated to people.  Mild to moderate expressive  aphasia, with limited words and language output, however follows simple commands.  Not able to name but able to repeat 3 word sentences.  No gaze palsy, blinking to visual threat bilaterally.  Mild right facial droop.  RUE no drift, but right wrist 1/5 and right hand grip 2/5. BLEs proximal 3/5 and distal 4/5.  Sensation subjectively symmetrical.  Finger-to-nose grossly intact bilaterally.  ASSESSMENT/PLAN Joan Carr is a 87 y.o. female with history of CHF, HTN, Stroke, DM HLD who presented with slurred speech, right facial droop and right side numbness. Family noticed this and administered 650mg  ASA.   Stroke extended: Acute small patchy left MCA infarcts extending to left frontal moderate infarct with left M2 severe stenosis/short segment occlusion, likely secondary to large vessel disease, although cardioembolic source can not be ruled out at this time.  Code stroke CTH No acute abnormality. ASPECTS 10.    MRI  head  Small, patchy acute infarcts in the posterior Left MCA territory. No associated hemorrhage or mass effect. 2. Underlying advanced chronic small vessel disease. MRA  head/neck: 1. Arterial tortuosity in the neck with no hemodynamically significant stenosis other than  a kinked appearance of the tortuous left ICA distal to the bulb. 2. Evidence of intracranial atherosclerosis, see Head MRA reported separately. 2D Echo EF 55 to 60% Consider 30 day cardiac event monitor as outpt LDL 152 HgbA1c 6.1 VTE prophylaxis - Lovenox and SCD's aspirin 325 mg daily prior to admission, now on aspirin 325 mg daily and clopidogrel 75 mg daily for 3 months given large vessel stenosis and then Plavix alone Therapy recommendations: SNF Disposition:  pending  Hypertension Home meds:  norvasc, coreg, losartan Stable 150-170 now Avoid low BP or quick BP drop Gradually decrease BP to goal within 2 to 3 days Long-term BP goal 130-150 given left MCA severe stenosis  Hyperlipidemia Home meds:   none LDL 152, goal < 70 Add atorvastatin 40mg    Continue statin at discharge  Other Stroke Risk Factors Advanced Age >/= 46  Obesity, Body mass index is 32.12 kg/m., BMI >/= 30 associated with increased stroke risk, recommend weight loss, diet and exercise as appropriate  Hx stroke/TIA Coronary artery disease Congestive heart failure   Hospital day # 1  Neurology will sign off. Please call with questions. Pt will follow up with stroke clinic NP at [facility] in about 4 weeks. Thanks for the consult.   [name], MD PhD Stroke Neurology 05/31/2021 7:50 PM    To contact Stroke Continuity provider, please refer to [email or web address] After hours, contact General Neurology

## 2021-05-31 NOTE — Evaluation (Signed)
Speech Language Pathology Evaluation Patient Details Name: Joan Carr MRN: [id number] DOB: August 05, 1923 Today's Date: 05/31/2021 Time: 4982-6415 SLP Time Calculation (min) (ACUTE ONLY): 25 min  Problem List:  Patient Active Problem List   Diagnosis Date Noted   Acute ischemic stroke ([place]) 05/29/2021   Stroke (cerebrum) ([place])    Hypertension    Diabetes ([place])    Essential hypertension 07/25/2020   Atypical chest pain 07/25/2020   Dyslipidemia 07/25/2020   CHF (congestive heart failure) [facility])    Past Medical History:  Past Medical History:  Diagnosis Date   Atypical chest pain 07/25/2020   CHF (congestive heart failure) ([place])    Diabetes ([place])    hx DM but undercontrolled   Dyslipidemia 07/25/2020   Essential hypertension 07/25/2020   Hypertension    Stroke (cerebrum) ([facility])    Past Surgical History:  Past Surgical History:  Procedure Laterality Date   APPENDECTOMY     BACK SURGERY     Ovary removed  Right    WRIST FRACTURE SURGERY Right    HPI:  87 year old past medical history of prior stroke, hypertension and diabetes who was brought into the emergency department with facial droop and right upper extremity weakness as a code stroke and was subsequently diagnosed with acute Ischemic stroke in the posterior left MCA territory/lateral and superior left parietal lobe area.  05/30/21 MRA head and neck showed short segment occlusion of the posterior left MCA M2 branch at the bifurcation which correlates with area of stroke; Passed Yale swallow screen on 05/30/21; SLE generated   Assessment / Plan / Recommendation Clinical Impression        Pt seen for speech/language assessment with dysarthria noted within simple conversation with speech being judged 50-75% intelligible within words-phrases.  Verbal expression tasks affected within confrontational naming, divergent/convergent naming and automatic repetition tasks.  Pt able to state simple words/phrases such as "yea", "no"  or "I don't know" with intermittent phrases such as "Im fine" or "right" noted throughout assessment.  Yes/no responses (simple) yielded 100% accuracy and pt able to follow 2- step directives accurately, but 3+ step directives were challenging.  Vocal quality noted to be low intensity/min hoarse during speaking with OME revealing decreased lingual lateralization and R facial/lingual deviation.  Pt denies any difficulty swallowing and passed Yale swallow screen.  Nursing confirmed no dysphagia noted with meals/medication administration.  Unable to complete most cognitive tasks d/t global aphasia (with expressive aphasia being more prominent than receptive aphasia.)  Pt would benefit from further ST in acute setting for dysarthria/aphasia and continued cognitive assessment as pt able.  Thank you for this referral.    SLP Assessment  SLP Recommendation/Assessment: Patient needs continued Speech Language Pathology Services SLP Visit Diagnosis: Aphasia (R47.01);Dysarthria and anarthria (R47.1)    Recommendations for follow up therapy are one component of a multi-disciplinary discharge planning process, led by the attending physician.  Recommendations may be updated based on patient status, additional functional criteria and insurance authorization.    Follow Up Recommendations  Skilled Nursing facility;24 hour supervision/assistance    Frequency and Duration min 2x/week  1 week      SLP Evaluation Cognition  Overall Cognitive Status: Difficult to assess (d/t expressive aphasia) Arousal/Alertness: Awake/alert Orientation Level: Oriented to person;Other (comment) (difficult to assess place,time,situation d/t expressive aphasia/limited verbalizations)       Comprehension  Auditory Comprehension Overall Auditory Comprehension: Impaired Yes/No Questions: Within Functional Limits (for simple information) Commands: Impaired Multistep Basic Commands: 25-49% accurate Conversation: Other (comment) (DTA  d/t expressive aphasia) EffectiveTechniques: Repetition;Visual/Gestural cues Visual Recognition/Discrimination Discrimination: Not tested Reading Comprehension Reading Status: Not tested    Expression Expression Primary Mode of Expression: Verbal Verbal Expression Overall Verbal Expression: Impaired Initiation: Impaired Automatic Speech: Name;Day of week;Social Response;Counting Level of Generative/Spontaneous Verbalization: Word;Phrase Repetition: Impaired Level of Impairment: Word level Naming: Impairment Responsive: 26-50% accurate Confrontation: Impaired Convergent: 0-24% accurate Divergent: 0-24% accurate Other Naming Comments: perseveration Verbal Errors: Perseveration;Aware of errors Pragmatics: Unable to assess Non-Verbal Means of Communication: Gestures Written Expression Dominant Hand: Right Written Expression: Unable to assess (comment) (dominant hand affected)   Oral / Motor  Oral Motor/Sensory Function Overall Oral Motor/Sensory Function: Mild impairment Facial ROM: Reduced right Facial Symmetry: Abnormal symmetry right Facial Sensation: Reduced right Lingual ROM: Reduced right;Reduced left Lingual Symmetry: Abnormal symmetry right Lingual Strength: Reduced Motor Speech Overall Motor Speech: Other (comment) (DTA) Respiration: Within functional limits Phonation: Hoarse;Low vocal intensity Resonance: Within functional limits Articulation: Impaired Level of Impairment: Word Intelligibility: Intelligibility reduced Word: 50-74% accurate Phrase: 50-74% accurate Sentence: Not tested Conversation: Not tested Motor Planning: Not tested Motor Speech Errors: Not applicable                       Elvina Sidle, M.S., CCC-SLP 05/31/2021, 1:35 PM

## 2021-05-31 NOTE — Progress Notes (Signed)
PROGRESS NOTE    LARIE MATHES  ZOX:[id number] DOB: December 01, 1922 DOA: 05/29/2021 PCP: Renaldo Reel, PA   Brief Narrative:  This is a 87 year old past medical history of prior stroke, hypertension and diabetes who was brought into the emergency department with facial droop and right upper extremity weakness as a code stroke and was subsequently diagnosed with acute Ischemic stroke in the posterior left MCA territory/lateral and superior left parietal lobe area.  MRA head and neck showed short segment occlusion of the posterior left MCA M2 branch at the bifurcation which correlates with area of stroke  Assessment & Plan:   Principal Problem:   Acute ischemic stroke [facility]) Active Problems:   Essential hypertension   Dyslipidemia   Diabetes ([place])  Acute ischemic stroke in posterior left MCA territory: MRI, MRI results as above.  Neurology saw this patient and recommend aspirin 325 mg along with Plavix, DAPT for 3 months and then Plavix alone.  On the morning of 05/30/2021, patient was more lethargic, worsened dysarthria and worsened weakness, MRI was repeated which showed worsening stroke, per neurology was likely secondary to hypotension.  Patient is much more alert today.  She is following commands.  Her blood pressure is improved as well.  PT OT recommends SNF.  TOC working on placement.  AKI: Resolved.  Acute metabolic encephalopathy: Secondary to stroke.  Appears to be resolved now.  Hyperlipidemia: Continue atorvastatin.  Hypertension: Neurology recommends goal to keep systolic between 811-914.  Appears to be at goal now.  Continue Coreg and amlodipine.  Prediabetes: Monitor.  Dementia: Slightly worse due to stroke.  DVT prophylaxis: enoxaparin (LOVENOX) injection 30 mg Start: 05/29/21 1000   Code Status: DNR  Family Communication: Daughter and granddaughter present at bedside.  Plan of care discussed with all of them.  Status is: Inpatient  Remains inpatient appropriate  because: Unsafe discharge/pending discharge to SNF.   Estimated body mass index is 32.12 kg/m as calculated from the following:   Height as of this encounter: 5\' 1"  (1.549 m).   Weight as of this encounter: 77.1 kg.  Nutritional Assessment: Body mass index is 32.12 kg/m.Marland Kitchen Seen by dietician.  I agree with the assessment and plan as outlined below: Nutrition Status:  Skin Assessment: I have examined the patient's skin and I agree with the wound assessment as performed by the wound care RN as outlined below:    Consultants:  Neurology  Procedures:  None  Antimicrobials:  Anti-infectives (From admission, onward)    None          Subjective: Patient seen and examined.  Daughter and granddaughter at the bedside.  Patient alert.  Has dysarthria.  Trying to communicate.  She states that she feels so-so but does not have any specific complaint.  She is following commands as well.  Objective: Vitals:   05/30/21 1910 05/30/21 2310 05/31/21 0311 05/31/21 0700  BP: (!) 155/76 (!) 169/61 (!) 130/50 (!) 126/49  Pulse: 86 79 66 77  Resp: 19 18 17 17   Temp: 98.3 F (36.8 C) 98.2 F (36.8 C) 98 F (36.7 C) 98 F (36.7 C)  TempSrc: Oral Oral Oral Oral  SpO2: 97% 97% 100% 98%  Weight:      Height:        Intake/Output Summary (Last 24 hours) at 05/31/2021 1114 Last data filed at 05/31/2021 0309 Gross per 24 hour  Intake --  Output 300 ml  Net -300 ml   Filed Weights   05/29/21 0000 05/29/21 2142  Weight: 82.4 kg 77.1 kg    Examination:  General exam: Appears calm and comfortable  Respiratory system: Clear to auscultation. Respiratory effort normal. Cardiovascular system: S1 & S2 heard, RRR. No JVD, murmurs, rubs, gallops or clicks. No pedal edema. Gastrointestinal system: Abdomen is nondistended, soft and nontender. No organomegaly or masses felt. Normal bowel sounds heard. Central nervous system: Alert and oriented.  Significant dysarthria.  Global weakness 4/5 in  all 4 extremities with right facial droop.    Data Reviewed: I have personally reviewed following labs and imaging studies  CBC: Recent Labs  Lab 05/29/21 0048 05/29/21 0054  WBC 8.5  --   NEUTROABS 6.4  --   HGB 13.1 13.3  HCT 40.1 39.0  MCV 105.0*  --   PLT 291  --     Basic Metabolic Panel: Recent Labs  Lab 05/29/21 0048 05/29/21 0054 05/30/21 0342  NA 138 139 139  K 4.7 4.5 3.9  CL 107 109 107  CO2 22  --  24  GLUCOSE 151* 147* 117*  BUN 21 22 14   CREATININE 1.38* 1.30* 0.96  CALCIUM 8.8*  --  8.6*    GFR: Estimated Creatinine Clearance: 32.2 mL/min (by C-G formula based on SCr of 0.96 mg/dL). Liver Function Tests: Recent Labs  Lab 05/29/21 0048  AST 14*  ALT 11  ALKPHOS 49  BILITOT 0.6  PROT 6.6  ALBUMIN 3.4*    No results for input(s): LIPASE, AMYLASE in the last 168 hours. No results for input(s): AMMONIA in the last 168 hours. Coagulation Profile: Recent Labs  Lab 05/29/21 0048  INR 1.0    Cardiac Enzymes: No results for input(s): CKTOTAL, CKMB, CKMBINDEX, TROPONINI in the last 168 hours. BNP (last 3 results) No results for input(s): PROBNP in the last 8760 hours. HbA1C: Recent Labs    05/29/21 0414  HGBA1C 6.1*    CBG: Recent Labs  Lab 05/29/21 0047 05/29/21 0413  GLUCAP 143* 134*    Lipid Profile: Recent Labs    05/29/21 0414  CHOL 214*  HDL 46  LDLCALC 152*  TRIG 78  CHOLHDL 4.7    Thyroid Function Tests: Recent Labs    05/29/21 0414  TSH 0.948    Anemia Panel: No results for input(s): VITAMINB12, FOLATE, FERRITIN, TIBC, IRON, RETICCTPCT in the last 72 hours. Sepsis Labs: No results for input(s): PROCALCITON, LATICACIDVEN in the last 168 hours.  Recent Results (from the past 240 hour(s))  Resp Panel by RT-PCR (Flu A&B, Covid) Nasopharyngeal Swab     Status: None   Collection Time: 05/29/21  2:02 AM   Specimen: Nasopharyngeal Swab; Nasopharyngeal(NP) swabs in vial transport medium  Result Value Ref Range  Status   SARS Coronavirus 2 by RT PCR NEGATIVE NEGATIVE Final    Comment: (NOTE) SARS-CoV-2 target nucleic acids are NOT DETECTED.  The SARS-CoV-2 RNA is generally detectable in upper respiratory specimens during the acute phase of infection. The lowest concentration of SARS-CoV-2 viral copies this assay can detect is 138 copies/mL. A negative result does not preclude SARS-Cov-2 infection and should not be used as the sole basis for treatment or other patient management decisions. A negative result may occur with  improper specimen collection/handling, submission of specimen other than nasopharyngeal swab, presence of viral mutation(s) within the areas targeted by this assay, and inadequate number of viral copies(<138 copies/mL). A negative result must be combined with clinical observations, patient history, and epidemiological information. The expected result is Negative.  Fact Sheet for Patients:  EntrepreneurPulse.com.au  Fact Sheet for Healthcare Providers:  IncredibleEmployment.be  This test is no t yet approved or cleared by the [place] FDA and  has been authorized for detection and/or diagnosis of SARS-CoV-2 by FDA under an Emergency Use Authorization (EUA). This EUA will remain  in effect (meaning this test can be used) for the duration of the COVID-19 declaration under Section 564(b)(1) of the Act, 21 U.S.C.section 360bbb-3(b)(1), unless the authorization is terminated  or revoked sooner.       Influenza A by PCR NEGATIVE NEGATIVE Final   Influenza B by PCR NEGATIVE NEGATIVE Final    Comment: (NOTE) The Xpert Xpress SARS-CoV-2/FLU/RSV plus assay is intended as an aid in the diagnosis of influenza from Nasopharyngeal swab specimens and should not be used as a sole basis for treatment. Nasal washings and aspirates are unacceptable for Xpert Xpress SARS-CoV-2/FLU/RSV testing.  Fact Sheet for  Patients: EntrepreneurPulse.com.au  Fact Sheet for Healthcare Providers: IncredibleEmployment.be  This test is not yet approved or cleared by the [place] FDA and has been authorized for detection and/or diagnosis of SARS-CoV-2 by FDA under an Emergency Use Authorization (EUA). This EUA will remain in effect (meaning this test can be used) for the duration of the COVID-19 declaration under Section 564(b)(1) of the Act, 21 U.S.C. section 360bbb-3(b)(1), unless the authorization is terminated or revoked.  Performed at [facility], [place] [address] [place], [place] [address]        Radiology Studies: MR BRAIN WO CONTRAST  Result Date: 05/30/2021 CLINICAL DATA:  Stroke, follow-up EXAM: MRI HEAD WITHOUT CONTRAST TECHNIQUE: Multiplanar, multiecho pulse sequences of the brain and surrounding structures were obtained without intravenous contrast. COMPARISON:  05/29/2021 2:50 a.m. FINDINGS: Sequences limited to diffusion-weighted imaging and susceptibility weighted imaging. Interval increase in the area of restricted diffusion in the posterior left frontal lobe, now measuring approximately 2.7 x 3.5 x 3.8 cm (AP x TR x CC), previously scattered areas in this territory. Restricted diffusion is also seen along the medial aspect of the insula (series 2, image 24). No acute hemorrhage, hydrocephalus, mass, mass effect, or midline shift. Increased vascular prominence on susceptibility weighted imaging (series 4, image 49), which may indicate luxury perfusion. IMPRESSION: Interval increase in the area of acute infarct in the posterior left frontal lobe, with additional areas of infarct seen in the left insula, consistent with left MCA territory infarct. These results were called by telephone at the time of interpretation on 05/30/2021 at 7:47 pm to provider Dr. Myna Hidalgo, Who verbally acknowledged these results. Electronically Signed   By: Merilyn Baba M.D.   On:  05/30/2021 20:12   ECHOCARDIOGRAM COMPLETE  Result Date: 05/29/2021    ECHOCARDIOGRAM REPORT   Patient Name:   MORGYN MARUT [facility] Date of Exam: 05/29/2021 Medical Rec #:  [id number]         Height:       63.0 in Accession #:    [id number]        Weight:       181.7 lb Date of Birth:  [birth year]         BSA:          1.856 m Patient Age:    58 years          BP:           159/97 mmHg Patient Gender: F                 HR:  84 bpm. Exam Location:  Inpatient Procedure: 2D Echo, Cardiac Doppler and Color Doppler Indications:   CVA  History:       Patient has no prior history of Echocardiogram examinations.                Stroke.  Sonographer:   NA Referring      [place] Phys: IMPRESSIONS  1. Left ventricular ejection fraction, by estimation, is 55 to 60%. The left ventricle has normal function. The left ventricle has no regional wall motion abnormalities. Left ventricular diastolic parameters are consistent with Grade I diastolic dysfunction (impaired relaxation).  2. Right ventricular systolic function is normal. The right ventricular size is normal. Tricuspid regurgitation signal is inadequate for assessing PA pressure.  3. The mitral valve is normal in structure. No evidence of mitral valve regurgitation. No evidence of mitral stenosis. Moderate mitral annular calcification.  4. The aortic valve is tricuspid. Aortic valve regurgitation is trivial. Mild aortic valve sclerosis is present, with no evidence of aortic valve stenosis.  5. The inferior vena cava is normal in size with greater than 50% respiratory variability, suggesting right atrial pressure of 3 mmHg. FINDINGS  Left Ventricle: Left ventricular ejection fraction, by estimation, is 55 to 60%. The left ventricle has normal function. The left ventricle has no regional wall motion abnormalities. The left ventricular internal cavity size was normal in size. There is  borderline left ventricular hypertrophy. Left ventricular diastolic  parameters are consistent with Grade I diastolic dysfunction (impaired relaxation). Right Ventricle: The right ventricular size is normal. No increase in right ventricular wall thickness. Right ventricular systolic function is normal. Tricuspid regurgitation signal is inadequate for assessing PA pressure. Left Atrium: Left atrial size was normal in size. Right Atrium: Right atrial size was normal in size. Pericardium: Trivial pericardial effusion is present. Mitral Valve: The mitral valve is normal in structure. Moderate mitral annular calcification. No evidence of mitral valve regurgitation. No evidence of mitral valve stenosis. Tricuspid Valve: The tricuspid valve is normal in structure. Tricuspid valve regurgitation is not demonstrated. Aortic Valve: The aortic valve is tricuspid. Aortic valve regurgitation is trivial. Mild aortic valve sclerosis is present, with no evidence of aortic valve stenosis. Pulmonic Valve: The pulmonic valve was normal in structure. Pulmonic valve regurgitation is trivial. Aorta: The aortic root is normal in size and structure. Venous: The inferior vena cava is normal in size with greater than 50% respiratory variability, suggesting right atrial pressure of 3 mmHg. IAS/Shunts: No atrial level shunt detected by color flow Doppler.  LEFT VENTRICLE PLAX 2D LVIDd:         3.90 cm   Diastology LVIDs:         2.80 cm   LV e' medial:    5.33 cm/s LV PW:         1.00 cm   LV E/e' medial:  11.6 LV IVS:        1.10 cm   LV e' lateral:   5.87 cm/s LVOT diam:     1.70 cm   LV E/e' lateral: 10.6 LV SV:         33 LV SV Index:   18 LVOT Area:     2.27 cm  RIGHT VENTRICLE            IVC RV S prime:     9.68 cm/s  IVC diam: 1.10 cm TAPSE (M-mode): 1.0 cm LEFT ATRIUM  Index        RIGHT ATRIUM           Index LA diam:        3.20 cm 1.72 cm/m   RA Area:     12.20 cm LA Vol (A2C):   37.4 ml 20.15 ml/m  RA Volume:   29.60 ml  15.95 ml/m LA Vol (A4C):   26.5 ml 14.28 ml/m LA Biplane Vol:  32.5 ml 17.51 ml/m  AORTIC VALVE LVOT Vmax:   75.70 cm/s LVOT Vmean:  49.300 cm/s LVOT VTI:    0.147 m  AORTA Ao Root diam: 2.50 cm Ao Asc diam:  2.90 cm MV E velocity: 62.00 cm/s MV A velocity: 149.00 cm/s  SHUNTS MV E/A ratio:  0.42         Systemic VTI:  0.15 m                             Systemic Diam: 1.70 cm Dalton McleanMD Electronically signed by Franki Monte Signature Date/Time: 05/29/2021/3:32:38 PM    Final     Scheduled Meds:   stroke: mapping our early stages of recovery book   Does not apply Once   amLODipine  5 mg Oral Daily   aspirin EC  325 mg Oral Daily   atorvastatin  40 mg Oral Daily   carvedilol  6.25 mg Oral BID WC   clopidogrel  75 mg Oral Daily   enoxaparin (LOVENOX) injection  30 mg Subcutaneous Q24H   fluticasone furoate-vilanterol  1 puff Inhalation Daily   traZODone  100 mg Oral QHS   umeclidinium bromide  1 puff Inhalation Daily   Continuous Infusions:   LOS: 1 day   Time spent: 29 minutes   Darliss Cheney, MD [facility]  05/31/2021, 11:14 AM  Please page via Amion and do not message via secure chat for anything urgent. Secure chat can be used for anything non urgent.  How to contact the [facility] Attending or Consulting provider [place] or covering provider during after hours [place], for this patient?  Check the care team in [facility] and look for a) attending/consulting TRH provider listed and b) the [facility] team listed. Page or secure chat [phone]. Log into [email or web address] and use [place]'s universal password to access. If you do not have the password, please contact the hospital operator. Locate the [facility] provider you are looking for under [facility] and page to a number that you can be directly reached. If you still have difficulty reaching the provider, please page the [facility] (Director on Call) for the Hospitalists listed on amion for assistance.

## 2021-06-01 LAB — RESP PANEL BY RT-PCR (FLU A&B, COVID) ARPGX2
Influenza A by PCR: NEGATIVE
Influenza B by PCR: NEGATIVE
SARS Coronavirus 2 by RT PCR: NEGATIVE

## 2021-06-01 NOTE — Progress Notes (Signed)
Physical Therapy Treatment Patient Details Name: Joan Carr MRN: [id number] DOB: 08-17-1922 Today's Date: 06/01/2021   History of Present Illness Pt is 87 yo female who presented to ED on 05/29/21 with R facial droop, R UE weakness and numbness.  Pt found to have posterior L MCA/lateral and superior L parietal lobe infarcts.  Additionally , pt with occlusion of posterior L MCA M2 branch.  Pt with hx of DM2, HTN, prior CVA    PT Comments    Pt is progressing well, able to hold herself up on the side of the bed in sitting today. She continues to be safer with the stedy standing frame for transfers and is getting some good motor return in her right leg.  Pt seems to understand everything being asked of her, but is unable to speak much.  PT will continue to follow acutely for safe mobility progression.   Recommendations for follow up therapy are one component of a multi-disciplinary discharge planning process, led by the attending physician.  Recommendations may be updated based on patient status, additional functional criteria and insurance authorization.  Follow Up Recommendations  SNF     Equipment Recommendations  Wheelchair cushion (measurements PT);Wheelchair (measurements PT)    Recommendations for Other Services       Precautions / Restrictions Precautions Precautions: Fall Precaution Comments: R hemiplegia     Mobility  Bed Mobility Overal bed mobility: Needs Assistance Bed Mobility: Supine to Sit Rolling: Mod assist Sidelying to sit: Mod assist       General bed mobility comments: Mod assist to support trunk primarily and help progress    Transfers Overall transfer level: Needs assistance Equipment used: 2 person hand held assist Transfers: Sit to/from [facility] Sit to Stand: +2 physical assistance;Mod assist Stand pivot transfers: +2 physical assistance;Max assist       General transfer comment: Two person heavy mod assist to stand  EOB without stedy standing frame, two person mod assist to stand up to the stedy standing frame and pt was seated for trasnfer to chair.  Heavy two person min assist to stand and mod assist to control descent down to low recliner chair.  Ambulation/Gait                 Stairs             Wheelchair Mobility    Modified Rankin (Stroke Patients Only) Modified Rankin (Stroke Patients Only) Pre-Morbid Rankin Score: Slight disability Modified Rankin: Severe disability     Balance Overall balance assessment: Needs assistance Sitting-balance support: Feet supported;Bilateral upper extremity supported Sitting balance-Leahy Scale: Fair Sitting balance - Comments: close supervision EOB.   Standing balance support: Bilateral upper extremity supported Standing balance-Leahy Scale: Zero Standing balance comment: two person mod to max assist in standing.                            Cognition Arousal/Alertness: Awake/alert Behavior During Therapy: Flat affect Overall Cognitive Status: Difficult to assess                                 General Comments: Seemed to understand everything I was saying, but had difficulty speaking.      Exercises      General Comments General comments (skin integrity, edema, etc.): VSS on RA throughout, however, 1 L O2 [place] on at beginning of session, so  re-applied at end of session.      Pertinent Vitals/Pain Pain Assessment: No/denies pain    Home Living                      Prior Function            PT Goals (current goals can now be found in the care plan section) Acute Rehab PT Goals Patient Stated Goal: None Stated Progress towards PT goals: Progressing toward goals    Frequency    Min 3X/week      PT Plan Current plan remains appropriate    Co-evaluation              AM-PAC PT "6 Clicks" Mobility   Outcome Measure  Help needed turning from your back to your side while in a  flat bed without using bedrails?: A Lot Help needed moving from lying on your back to sitting on the side of a flat bed without using bedrails?: A Lot Help needed moving to and from a bed to a chair (including a wheelchair)?: Total Help needed standing up from a chair using your arms (e.g., wheelchair or bedside chair)?: Total Help needed to walk in hospital room?: Total Help needed climbing 3-5 steps with a railing? : Total 6 Click Score: 8    End of Session Equipment Utilized During Treatment: Gait belt Activity Tolerance: Patient limited by fatigue Patient left: in chair;with call bell/phone within reach;with chair alarm set   PT Visit Diagnosis: Unsteadiness on feet (R26.81);Muscle weakness (generalized) (M62.81);Hemiplegia and hemiparesis Hemiplegia - Right/Left: Right Hemiplegia - dominant/non-dominant: Dominant Hemiplegia - caused by: Cerebral infarction     Time: 6979-4801 PT Time Calculation (min) (ACUTE ONLY): 23 min  Charges:  $Therapeutic Activity: 23-37 mins                    Verdene Lennert, PT, DPT  Acute Rehabilitation Ortho Tech Supervisor [phone] pager [phone]) [phone] office

## 2021-06-01 NOTE — TOC Progression Note (Signed)
Transition of Care [facility]) - Progression Note    Patient Details  Name: Joan Carr MRN: [id number] Date of Birth: 07-08-1923  Transition of Care [facility]) CM/SW Contact  Pollie Friar, RN Phone Number: 06/01/2021, 8:31 AM  Clinical Narrative:    Patient insurance has approved SNF rehab: approved 10/21 - 10/25, next review date 10/25, navi id# [id number], plan auth id # [id number]  [facility] [place] wont have a bed until tomorrow. Daughter aware. Will plan on d/c to [facility] in am.  ToC following.   Expected Discharge Plan: [place] Barriers to Discharge: Continued Medical Work up  Expected Discharge Plan and Services Expected Discharge Plan: [place] In-house Referral: Clinical Social Work Discharge Planning Services: CM Consult Post Acute Care Choice: [place] arrangements for the past 2 months: Single Family Home                                       Social Determinants of Health (SDOH) Interventions    Readmission Risk Interventions No flowsheet data found.

## 2021-06-01 NOTE — Progress Notes (Signed)
PROGRESS NOTE    Joan Carr  WUJ:[id number] DOB: 25-Nov-1922 DOA: 05/29/2021 PCP: Renaldo Reel, PA   Brief Narrative:  This is a 87 year old past medical history of prior stroke, hypertension and diabetes who was brought into the emergency department with facial droop and right upper extremity weakness as a code stroke and was subsequently diagnosed with acute Ischemic stroke in the posterior left MCA territory/lateral and superior left parietal lobe area.  MRA head and neck showed short segment occlusion of the posterior left MCA M2 branch at the bifurcation which correlates with area of stroke  06/01/2021:  Patient seen and examined.  Also discussed with the patient's Nurse.  Speech problems persist.  Right sided weakness is noted.  Neurology input is appreciated.  Assessment & Plan:   Principal Problem:   Acute ischemic stroke [facility]) Active Problems:   Essential hypertension   Dyslipidemia   Diabetes ([place])  Acute ischemic stroke in posterior left MCA territory: MRI, MRI results as above.  Neurology saw this patient and recommend aspirin 325 mg along with Plavix, DAPT for 3 months and then Plavix alone.  On the morning of 05/30/2021, patient was more lethargic, worsened dysarthria and worsened weakness, MRI was repeated which showed worsening stroke, per neurology was likely secondary to hypotension.  Patient is much more alert today.  She is following commands.  Her blood pressure is improved as well.  PT OT recommends SNF.  TOC working on placement. 06/01/2021: Patient is awaiting placement.  AKI: Resolved.  Acute metabolic encephalopathy: Secondary to stroke.  Appears to be resolved now.  Hyperlipidemia: Continue atorvastatin.  Hypertension: Neurology recommends goal to keep systolic between 956-213.  Appears to be at goal now.  Continue Coreg and amlodipine.  Prediabetes: Monitor.  Dementia: Slightly worse due to stroke.  DVT prophylaxis: enoxaparin (LOVENOX)  injection 30 mg Start: 05/29/21 1000   Code Status: DNR  Family Communication:   Status is: Inpatient  Remains inpatient appropriate because: Unsafe discharge/pending discharge to SNF.   Estimated body mass index is 32.12 kg/m as calculated from the following:   Height as of this encounter: 5\' 1"  (1.549 m).   Weight as of this encounter: 77.1 kg.  Nutritional Assessment: Body mass index is 32.12 kg/m.Marland Kitchen Seen by dietician.  I agree with the assessment and plan as outlined below: Nutrition Status:  Consultants:  Neurology  Procedures:  None  Antimicrobials:  Anti-infectives (From admission, onward)    None          Subjective: Patient seen and examined.   No significant history from patient due to dysphasia/aphasia.   Objective: Vitals:   05/31/21 2100 05/31/21 2329 06/01/21 0426 06/01/21 0729  BP: (!) 144/70 (!) 152/61 (!) 152/66 (!) (P) 161/63  Pulse: 78 70 63 (P) 78  Resp: 16 16 17  (P) 18  Temp: 98.7 F (37.1 C) 98.1 F (36.7 C) 98.1 F (36.7 C) (P) 98 F (36.7 C)  TempSrc: Oral Oral Oral (P) Axillary  SpO2: 100% 99% 99% (P) 97%  Weight:      Height:        Intake/Output Summary (Last 24 hours) at 06/01/2021 1022 Last data filed at 06/01/2021 0800 Gross per 24 hour  Intake 240 ml  Output 850 ml  Net -610 ml    Filed Weights   05/29/21 0000 05/29/21 2142  Weight: 82.4 kg 77.1 kg    Examination:  General exam: Appears calm and comfortable  Respiratory system: Clear to auscultation.  Cardiovascular system: S1 &  S2  Gastrointestinal system: Abdomen is soft and nontender.  Central nervous system: Dysphasic/aphasic, right sided weakness. Extremities: Fullness of the ankle/edema.   Data Reviewed: I have personally reviewed following labs and imaging studies  CBC: Recent Labs  Lab 05/29/21 0048 05/29/21 0054  WBC 8.5  --   NEUTROABS 6.4  --   HGB 13.1 13.3  HCT 40.1 39.0  MCV 105.0*  --   PLT 291  --     Basic Metabolic  Panel: Recent Labs  Lab 05/29/21 0048 05/29/21 0054 05/30/21 0342  NA 138 139 139  K 4.7 4.5 3.9  CL 107 109 107  CO2 22  --  24  GLUCOSE 151* 147* 117*  BUN 21 22 14   CREATININE 1.38* 1.30* 0.96  CALCIUM 8.8*  --  8.6*    GFR: Estimated Creatinine Clearance: 32.2 mL/min (by C-G formula based on SCr of 0.96 mg/dL). Liver Function Tests: Recent Labs  Lab 05/29/21 0048  AST 14*  ALT 11  ALKPHOS 49  BILITOT 0.6  PROT 6.6  ALBUMIN 3.4*    No results for input(s): LIPASE, AMYLASE in the last 168 hours. No results for input(s): AMMONIA in the last 168 hours. Coagulation Profile: Recent Labs  Lab 05/29/21 0048  INR 1.0    Cardiac Enzymes: No results for input(s): CKTOTAL, CKMB, CKMBINDEX, TROPONINI in the last 168 hours. BNP (last 3 results) No results for input(s): PROBNP in the last 8760 hours. HbA1C: No results for input(s): HGBA1C in the last 72 hours.  CBG: Recent Labs  Lab 05/29/21 0047 05/29/21 0413  GLUCAP 143* 134*    Lipid Profile: No results for input(s): CHOL, HDL, LDLCALC, TRIG, CHOLHDL, LDLDIRECT in the last 72 hours.  Thyroid Function Tests: No results for input(s): TSH, T4TOTAL, FREET4, T3FREE, THYROIDAB in the last 72 hours.  Anemia Panel: No results for input(s): VITAMINB12, FOLATE, FERRITIN, TIBC, IRON, RETICCTPCT in the last 72 hours. Sepsis Labs: No results for input(s): PROCALCITON, LATICACIDVEN in the last 168 hours.  Recent Results (from the past 240 hour(s))  Resp Panel by RT-PCR (Flu A&B, Covid) Nasopharyngeal Swab     Status: None   Collection Time: 05/29/21  2:02 AM   Specimen: Nasopharyngeal Swab; Nasopharyngeal(NP) swabs in vial transport medium  Result Value Ref Range Status   SARS Coronavirus 2 by RT PCR NEGATIVE NEGATIVE Final    Comment: (NOTE) SARS-CoV-2 target nucleic acids are NOT DETECTED.  The SARS-CoV-2 RNA is generally detectable in upper respiratory specimens during the acute phase of infection. The  lowest concentration of SARS-CoV-2 viral copies this assay can detect is 138 copies/mL. A negative result does not preclude SARS-Cov-2 infection and should not be used as the sole basis for treatment or other patient management decisions. A negative result may occur with  improper specimen collection/handling, submission of specimen other than nasopharyngeal swab, presence of viral mutation(s) within the areas targeted by this assay, and inadequate number of viral copies(<138 copies/mL). A negative result must be combined with clinical observations, patient history, and epidemiological information. The expected result is Negative.  Fact Sheet for Patients:  EntrepreneurPulse.com.au  Fact Sheet for Healthcare Providers:  IncredibleEmployment.be  This test is no t yet approved or cleared by the [place] FDA and  has been authorized for detection and/or diagnosis of SARS-CoV-2 by FDA under an Emergency Use Authorization (EUA). This EUA will remain  in effect (meaning this test can be used) for the duration of the COVID-19 declaration under Section 564(b)(1) of the  Act, 21 U.S.C.section 360bbb-3(b)(1), unless the authorization is terminated  or revoked sooner.       Influenza A by PCR NEGATIVE NEGATIVE Final   Influenza B by PCR NEGATIVE NEGATIVE Final    Comment: (NOTE) The Xpert Xpress SARS-CoV-2/FLU/RSV plus assay is intended as an aid in the diagnosis of influenza from Nasopharyngeal swab specimens and should not be used as a sole basis for treatment. Nasal washings and aspirates are unacceptable for Xpert Xpress SARS-CoV-2/FLU/RSV testing.  Fact Sheet for Patients: EntrepreneurPulse.com.au  Fact Sheet for Healthcare Providers: IncredibleEmployment.be  This test is not yet approved or cleared by the [place] FDA and has been authorized for detection and/or diagnosis of SARS-CoV-2 by FDA under  an Emergency Use Authorization (EUA). This EUA will remain in effect (meaning this test can be used) for the duration of the COVID-19 declaration under Section 564(b)(1) of the Act, 21 U.S.C. section 360bbb-3(b)(1), unless the authorization is terminated or revoked.  Performed at [facility], [place] [address], [place], [place] [address]        Radiology Studies: MR BRAIN WO CONTRAST  Result Date: 05/30/2021 CLINICAL DATA:  Stroke, follow-up EXAM: MRI HEAD WITHOUT CONTRAST TECHNIQUE: Multiplanar, multiecho pulse sequences of the brain and surrounding structures were obtained without intravenous contrast. COMPARISON:  05/29/2021 2:50 a.m. FINDINGS: Sequences limited to diffusion-weighted imaging and susceptibility weighted imaging. Interval increase in the area of restricted diffusion in the posterior left frontal lobe, now measuring approximately 2.7 x 3.5 x 3.8 cm (AP x TR x CC), previously scattered areas in this territory. Restricted diffusion is also seen along the medial aspect of the insula (series 2, image 24). No acute hemorrhage, hydrocephalus, mass, mass effect, or midline shift. Increased vascular prominence on susceptibility weighted imaging (series 4, image 49), which may indicate luxury perfusion. IMPRESSION: Interval increase in the area of acute infarct in the posterior left frontal lobe, with additional areas of infarct seen in the left insula, consistent with left MCA territory infarct. These results were called by telephone at the time of interpretation on 05/30/2021 at 7:47 pm to provider Dr. Myna Hidalgo, Who verbally acknowledged these results. Electronically Signed   By: Merilyn Baba M.D.   On: 05/30/2021 20:12    Scheduled Meds:   stroke: mapping our early stages of recovery book   Does not apply Once   amLODipine  5 mg Oral Daily   aspirin EC  325 mg Oral Daily   atorvastatin  40 mg Oral Daily   carvedilol  6.25 mg Oral BID WC   clopidogrel  75 mg Oral Daily    enoxaparin (LOVENOX) injection  30 mg Subcutaneous Q24H   fluticasone furoate-vilanterol  1 puff Inhalation Daily   traZODone  100 mg Oral QHS   umeclidinium bromide  1 puff Inhalation Daily   Continuous Infusions:   LOS: 2 days   Time spent: 36 minutes   Bonnell Public, MD [facility]  06/01/2021, 10:22 AM  Please page via Shea Evans and do not message via secure chat for anything urgent. Secure chat can be used for anything non urgent.  How to contact the [facility] Attending or Consulting provider [place] or covering provider during after hours [place], for this patient?  Check the care team in [facility] and look for a) attending/consulting TR[facility] provider listed and b) the [facility] team listed. Page or secure chat [phone]. Log into [email or web address] and use [place]'s universal password to access. If you do not have the  password, please contact the hospital operator. Locate the [facility] provider you are looking for under [facility] and page to a number that you can be directly reached. If you still have difficulty reaching the provider, please page the [facility] (Director on Call) for the Hospitalists listed on amion for assistance.

## 2021-06-01 NOTE — Care Management Important Message (Signed)
Important Message  Patient Details  Name: Joan Carr MRN: [id number] Date of Birth: [birth year]   Medicare Important Message Given:  Yes     Orbie Pyo 06/01/2021, 3:24 PM

## 2021-06-02 DIAGNOSIS — J449 Chronic obstructive pulmonary disease, unspecified: Secondary | ICD-10-CM | POA: Diagnosis not present

## 2021-06-02 DIAGNOSIS — R2689 Other abnormalities of gait and mobility: Secondary | ICD-10-CM | POA: Diagnosis not present

## 2021-06-02 DIAGNOSIS — G4700 Insomnia, unspecified: Secondary | ICD-10-CM | POA: Diagnosis not present

## 2021-06-02 DIAGNOSIS — E785 Hyperlipidemia, unspecified: Secondary | ICD-10-CM | POA: Diagnosis not present

## 2021-06-02 DIAGNOSIS — R1310 Dysphagia, unspecified: Secondary | ICD-10-CM | POA: Diagnosis not present

## 2021-06-02 DIAGNOSIS — R262 Difficulty in walking, not elsewhere classified: Secondary | ICD-10-CM | POA: Diagnosis not present

## 2021-06-02 DIAGNOSIS — R531 Weakness: Secondary | ICD-10-CM | POA: Diagnosis not present

## 2021-06-02 DIAGNOSIS — I10 Essential (primary) hypertension: Secondary | ICD-10-CM | POA: Diagnosis not present

## 2021-06-02 DIAGNOSIS — I69320 Aphasia following cerebral infarction: Secondary | ICD-10-CM | POA: Diagnosis not present

## 2021-06-02 DIAGNOSIS — I639 Cerebral infarction, unspecified: Secondary | ICD-10-CM | POA: Diagnosis not present

## 2021-06-02 DIAGNOSIS — M6281 Muscle weakness (generalized): Secondary | ICD-10-CM | POA: Diagnosis not present

## 2021-06-02 DIAGNOSIS — I69322 Dysarthria following cerebral infarction: Secondary | ICD-10-CM | POA: Diagnosis not present

## 2021-06-02 DIAGNOSIS — R7309 Other abnormal glucose: Secondary | ICD-10-CM | POA: Diagnosis not present

## 2021-06-02 DIAGNOSIS — M2550 Pain in unspecified joint: Secondary | ICD-10-CM | POA: Diagnosis not present

## 2021-06-02 DIAGNOSIS — Z7401 Bed confinement status: Secondary | ICD-10-CM | POA: Diagnosis not present

## 2021-06-02 DIAGNOSIS — I6350 Cerebral infarction due to unspecified occlusion or stenosis of unspecified cerebral artery: Secondary | ICD-10-CM | POA: Diagnosis not present

## 2021-06-02 DIAGNOSIS — I69951 Hemiplegia and hemiparesis following unspecified cerebrovascular disease affecting right dominant side: Secondary | ICD-10-CM | POA: Diagnosis not present

## 2021-06-02 MED ORDER — ATORVASTATIN CALCIUM 40 MG PO TABS: 40.0000 mg | ORAL_TABLET | Freq: Every day | ORAL | 0 refills | Status: AC

## 2021-06-02 MED ORDER — CLOPIDOGREL BISULFATE 75 MG PO TABS: 75.0000 mg | ORAL_TABLET | Freq: Every day | ORAL | 0 refills | Status: AC

## 2021-06-02 MED ORDER — ASPIRIN 325 MG PO TBEC: 325.0000 mg | DELAYED_RELEASE_TABLET | Freq: Every day | ORAL | 0 refills | Status: AC

## 2021-06-02 NOTE — Progress Notes (Signed)
[facility] here to transport patient. Called to give report for 5th time. Call was sent to Director of Nursing voicemail. Left message with call back number.

## 2021-06-02 NOTE — Discharge Summary (Signed)
Physician Discharge Summary  Patient ID: Joan Carr MRN: [id number] DOB/AGE: Dec 21, 1922 87 y.o.  Admit date: 05/29/2021 Discharge date: 06/02/2021  Admission Diagnoses:  Discharge Diagnoses:  Principal Problem:   Acute ischemic stroke [facility]) Active Problems:   Essential hypertension   Dyslipidemia   Diabetes [name])   Discharged Condition: stable  Hospital Course: Patient is a 87 year old Caucasian female with past medical history significant for prior stroke, hypertension and diabetes mellitus.  Patient presented to the emergency department with facial droop and right upper extremity weakness as a code stroke and was subsequently diagnosed with acute Ischemic stroke in the posterior left MCA territory/lateral and superior left parietal lobe area.  MRA head and neck showed short segment occlusion of the posterior left MCA M2 branch at the bifurcation which correlates with area of stroke.  Neurology team was consulted to assist with patient's management.  Patient was managed with Plavix 75 Mg p.o. once daily and aspirin 325 Mg p.o. once daily.  Patient will continue on aspirin and Plavix for the next 3 months and only Plavix afterwards.  Patient has continued to improve.  Patient was discharged to the skilled nursing facility today.  Neurology team is also cleared patient for discharge.    Acute ischemic stroke in posterior left MCA territory:  -See above documentation. -Patient will be discharged on aspirin and Plavix for 3 months and then only Plavix afterwards. -Patient has been significant improvement. Patient will discharge to skilled nursing facility.     AKI: Resolved.   Acute metabolic encephalopathy:  -Secondary to stroke.   -Resolved.     Hyperlipidemia: -Continue atorvastatin.   Hypertension:  --Optimize.     Prediabetes: Continue to monitor.   Dementia:  -No behavioral problems.     Consults: neurology  Significant Diagnostic Studies: radiology:  MRI of  the brain reveals: FINDINGS: Sequences limited to diffusion-weighted imaging and susceptibility weighted imaging.   Interval increase in the area of restricted diffusion in the posterior left frontal lobe, now measuring approximately 2.7 x 3.5 x 3.8 cm (AP x TR x CC), previously scattered areas in this territory. Restricted diffusion is also seen along the medial aspect of the insula (series 2, image 24). No acute hemorrhage, hydrocephalus, mass, mass effect, or midline shift.   Increased vascular prominence on susceptibility weighted imaging (series 4, image 49), which may indicate luxury perfusion.   IMPRESSION: Interval increase in the area of acute infarct in the posterior left frontal lobe, with additional areas of infarct seen in the left insula, consistent with left MCA territory infarct.  MRI Angio without contrast revealed: 1. Positive for severe stenosis or short segment occlusion of a posterior Left MCA M2 branch at the bifurcation, concordant with the patchy acute ischemia by MRI. Additional mild to moderate stenosis at the left MCA bifurcation.   2. Other intracranial atherosclerosis including up to Moderate stenoses of the : - left ACA A2, - left Anterior Temporal Artery, - left PCA P1/P2 junction  MRI Angio Neck revealed: 1. Arterial tortuosity in the neck with no hemodynamically significant stenosis other than a kinked appearance of the tortuous left ICA distal to the bulb. 2. Evidence of intracranial atherosclerosis, see Head MRA reported separately.  Echo revealed: 1. Left ventricular ejection fraction, by estimation, is 55 to 60%. The  left ventricle has normal function. The left ventricle has no regional  wall motion abnormalities. Left ventricular diastolic parameters are  consistent with Grade I diastolic  dysfunction (impaired relaxation).   2. Right  ventricular systolic function is normal. The right ventricular  size is normal. Tricuspid  regurgitation signal is inadequate for assessing  PA pressure.   3. The mitral valve is normal in structure. No evidence of mitral valve  regurgitation. No evidence of mitral stenosis. Moderate mitral annular  calcification.   4. The aortic valve is tricuspid. Aortic valve regurgitation is trivial.  Mild aortic valve sclerosis is present, with no evidence of aortic valve  stenosis.   5. The inferior vena cava is normal in size with greater than 50%  respiratory variability, suggesting right atrial pressure of 3 mmHg.   Discharge Exam: Blood pressure (!) 124/50, pulse 77, temperature 97.9 F (36.6 C), temperature source Oral, resp. rate 17, height 5\' 1"  (1.549 m), weight 77.1 kg, SpO2 97 %.   Disposition: Discharge disposition: 03-Skilled [place]       Discharge Instructions     Ambulatory referral to Neurology   Complete by: As directed    Follow up with stroke clinic NP (Jessica Vanschaick or Cecille Rubin, if both not available, consider Zachery Dauer, or [place]) at [facility] in about 4 weeks. Thanks.   Diet - low sodium heart healthy   Complete by: As directed    Increase activity slowly   Complete by: As directed       Allergies as of 06/02/2021       Reactions   Penicillins Hives        Medication List     STOP taking these medications    furosemide 20 MG tablet Commonly known as: LASIX   losartan 100 MG tablet Commonly known as: COZAAR   traMADol 50 MG tablet Commonly known as: ULTRAM       TAKE these medications    acetaminophen 500 MG tablet Commonly known as: TYLENOL Take 500 mg by mouth every 6 (six) hours as needed for moderate pain or headache.   amLODipine 5 MG tablet Commonly known as: NORVASC Take 1 tablet (5 mg total) by mouth daily.   aspirin 325 MG EC tablet Take 1 tablet (325 mg total) by mouth daily. Start taking on: June 03, 2021 What changed:  how much to take when to take this additional instructions    atorvastatin 40 MG tablet Commonly known as: LIPITOR Take 1 tablet (40 mg total) by mouth daily. Start taking on: June 03, 2021   carvedilol 6.25 MG tablet Commonly known as: COREG Take 1 tablet (6.25 mg total) by mouth 2 (two) times daily.   clopidogrel 75 MG tablet Commonly known as: PLAVIX Take 1 tablet (75 mg total) by mouth daily. Start taking on: June 03, 2021   meclizine 12.5 MG tablet Commonly known as: ANTIVERT Take 12.5 mg by mouth every 8 (eight) hours as needed for dizziness.   traZODone 100 MG tablet Commonly known as: DESYREL Take 100 mg by mouth at bedtime.   Trelegy Ellipta 200-62.5-25 MCG/ACT Aepb Generic drug: Fluticasone-Umeclidin-Vilant Inhale 1 puff into the lungs daily.        Follow-up Information     [facility]. Schedule an appointment as soon as possible for a visit in 1 month(s).   Specialty: Neurology Why: stroke clinic Contact information: [address] [place] [phone]                Signed: Bonnell Public 06/02/2021, 11:04 AM

## 2021-06-02 NOTE — Progress Notes (Signed)
Called to give report to [facility] in [place] x2 no answer. Will attempt again at a later time

## 2021-06-02 NOTE — TOC Transition Note (Signed)
Transition of Care [facility]) - CM/SW Discharge Note   Patient Details  Name: Joan Carr MRN: [id number] Date of Birth: May 24, 1923  [facility]) CM/SW Contact:  Coralee Pesa, [place] Phone Number: 06/02/2021, 11:50 AM   Clinical Narrative:    Pt to be transported to [facility] via [place]. Nurse to call report to [phone] ext. [phone].   Final next level of care: Skilled Nursing Facility Barriers to Discharge: Barriers Resolved   Patient Goals and CMS Choice   CMS Medicare.gov Compare Post Acute Care list provided to:: Patient Represenative (must comment) Choice offered to / list presented to : Adult Children  Discharge Placement              Patient chooses bed at: [place], [place] Patient to be transferred to facility by: [place] Name of family member notified: Margaretha [place] Patient and family notified of of transfer: 06/02/21  Discharge Plan and Services In-house Referral: Clinical Social Work Discharge Planning Services: [employer] Consult Post Acute Care Choice: [place]                               Social Determinants of Health (SDOH) Interventions     Readmission Risk Interventions No flowsheet data found.

## 2021-06-02 NOTE — Plan of Care (Signed)

## 2021-06-02 NOTE — Progress Notes (Signed)
Attempted to call report to [facility] in [place], phone continued ringing, still no answer.  Tried again, got hold of Colletta [place], asked to call back to extension [phone] as listed in LCSW note. Will try again shortly.

## 2021-06-03 DIAGNOSIS — R1310 Dysphagia, unspecified: Secondary | ICD-10-CM | POA: Diagnosis not present

## 2021-06-03 DIAGNOSIS — E785 Hyperlipidemia, unspecified: Secondary | ICD-10-CM | POA: Diagnosis not present

## 2021-06-03 DIAGNOSIS — I639 Cerebral infarction, unspecified: Secondary | ICD-10-CM | POA: Diagnosis not present

## 2021-06-03 DIAGNOSIS — R262 Difficulty in walking, not elsewhere classified: Secondary | ICD-10-CM | POA: Diagnosis not present

## 2021-07-11 DIAGNOSIS — R4701 Aphasia: Secondary | ICD-10-CM | POA: Diagnosis not present

## 2021-07-11 DIAGNOSIS — G8929 Other chronic pain: Secondary | ICD-10-CM | POA: Diagnosis not present

## 2021-07-11 DIAGNOSIS — E119 Type 2 diabetes mellitus without complications: Secondary | ICD-10-CM | POA: Diagnosis not present

## 2021-07-11 DIAGNOSIS — Z79899 Other long term (current) drug therapy: Secondary | ICD-10-CM | POA: Diagnosis not present

## 2021-07-11 DIAGNOSIS — Z8673 Personal history of transient ischemic attack (TIA), and cerebral infarction without residual deficits: Secondary | ICD-10-CM | POA: Diagnosis not present

## 2021-07-11 DIAGNOSIS — I69359 Hemiplegia and hemiparesis following cerebral infarction affecting unspecified side: Secondary | ICD-10-CM | POA: Diagnosis not present

## 2021-07-11 DIAGNOSIS — Z7689 Persons encountering health services in other specified circumstances: Secondary | ICD-10-CM | POA: Diagnosis not present

## 2021-07-11 DIAGNOSIS — J441 Chronic obstructive pulmonary disease with (acute) exacerbation: Secondary | ICD-10-CM | POA: Diagnosis not present

## 2021-07-11 DIAGNOSIS — M549 Dorsalgia, unspecified: Secondary | ICD-10-CM | POA: Diagnosis not present

## 2021-07-11 DIAGNOSIS — Z789 Other specified health status: Secondary | ICD-10-CM | POA: Diagnosis not present

## 2021-07-11 DIAGNOSIS — G4700 Insomnia, unspecified: Secondary | ICD-10-CM | POA: Diagnosis not present

## 2021-07-11 DIAGNOSIS — R531 Weakness: Secondary | ICD-10-CM | POA: Diagnosis not present

## 2021-07-11 DIAGNOSIS — J449 Chronic obstructive pulmonary disease, unspecified: Secondary | ICD-10-CM | POA: Diagnosis not present

## 2021-07-12 ENCOUNTER — Inpatient Hospital Stay: Payer: Medicare Other | Admitting: Adult Health

## 2021-10-08 ENCOUNTER — Other Ambulatory Visit: Payer: Self-pay | Admitting: Internal Medicine
